# Patient Record
Sex: Male | Born: 1990 | Race: Black or African American | Hispanic: No
Health system: Western US, Academic
[De-identification: ages and names within clinical notes are randomized; demographics above are authoritative.]

---

## 2019-12-05 ENCOUNTER — Inpatient Hospital Stay: Admit: 2019-12-05 | Payer: Self-pay

## 2019-12-28 ENCOUNTER — Inpatient Hospital Stay (HOSPITAL_BASED_OUTPATIENT_CLINIC_OR_DEPARTMENT_OTHER): Payer: TRICARE Prime—HMO

## 2019-12-28 ENCOUNTER — Ambulatory Visit (HOSPITAL_BASED_OUTPATIENT_CLINIC_OR_DEPARTMENT_OTHER): Payer: TRICARE Prime—HMO

## 2019-12-28 ENCOUNTER — Inpatient Hospital Stay
Admission: AC | Admit: 2019-12-28 | Discharge: 2020-01-02 | DRG: 956 | Disposition: A | Discharge status: Other Acute Inpatient Hospital | Payer: TRICARE Prime—HMO | Attending: Surgery | Admitting: Surgery

## 2019-12-28 ENCOUNTER — Encounter (HOSPITAL_COMMUNITY): Payer: Self-pay | Admitting: Anesthesiology

## 2019-12-28 DIAGNOSIS — S82492A Other fracture of shaft of left fibula, initial encounter for closed fracture: Secondary | ICD-10-CM

## 2019-12-28 DIAGNOSIS — S22062D Unstable burst fracture of T7-T8 vertebra, subsequent encounter for fracture with routine healing: Secondary | ICD-10-CM

## 2019-12-28 DIAGNOSIS — T797XXA Traumatic subcutaneous emphysema, initial encounter: Secondary | ICD-10-CM

## 2019-12-28 DIAGNOSIS — S06309A Unspecified focal traumatic brain injury with loss of consciousness of unspecified duration, initial encounter: Secondary | ICD-10-CM

## 2019-12-28 DIAGNOSIS — Z7409 Other reduced mobility: Secondary | ICD-10-CM

## 2019-12-28 DIAGNOSIS — S066X0A Traumatic subarachnoid hemorrhage without loss of consciousness, initial encounter: Secondary | ICD-10-CM

## 2019-12-28 DIAGNOSIS — S22069A Unspecified fracture of T7-T8 vertebra, initial encounter for closed fracture: Secondary | ICD-10-CM

## 2019-12-28 DIAGNOSIS — M19071 Primary osteoarthritis, right ankle and foot: Secondary | ICD-10-CM

## 2019-12-28 DIAGNOSIS — S82832A Other fracture of upper and lower end of left fibula, initial encounter for closed fracture: Secondary | ICD-10-CM | POA: Diagnosis present

## 2019-12-28 DIAGNOSIS — D62 Acute posthemorrhagic anemia: Secondary | ICD-10-CM

## 2019-12-28 DIAGNOSIS — S22060A Wedge compression fracture of T7-T8 vertebra, initial encounter for closed fracture: Secondary | ICD-10-CM

## 2019-12-28 DIAGNOSIS — S22079A Unspecified fracture of T9-T10 vertebra, initial encounter for closed fracture: Secondary | ICD-10-CM

## 2019-12-28 DIAGNOSIS — K5900 Constipation, unspecified: Secondary | ICD-10-CM

## 2019-12-28 DIAGNOSIS — M19072 Primary osteoarthritis, left ankle and foot: Secondary | ICD-10-CM

## 2019-12-28 DIAGNOSIS — S12600A Unspecified displaced fracture of seventh cervical vertebra, initial encounter for closed fracture: Secondary | ICD-10-CM | POA: Diagnosis present

## 2019-12-28 DIAGNOSIS — S062X9A Diffuse traumatic brain injury with loss of consciousness of unspecified duration, initial encounter: Secondary | ICD-10-CM | POA: Diagnosis present

## 2019-12-28 DIAGNOSIS — S06301A Unspecified focal traumatic brain injury with loss of consciousness of 30 minutes or less, initial encounter: Secondary | ICD-10-CM

## 2019-12-28 DIAGNOSIS — S72302A Unspecified fracture of shaft of left femur, initial encounter for closed fracture: Secondary | ICD-10-CM

## 2019-12-28 DIAGNOSIS — R2689 Other abnormalities of gait and mobility: Secondary | ICD-10-CM

## 2019-12-28 DIAGNOSIS — R4020 Unspecified coma: Secondary | ICD-10-CM

## 2019-12-28 DIAGNOSIS — S064XAA Epidural hemorrhage with loss of consciousness status unknown, initial encounter (CMS-HCC): Secondary | ICD-10-CM

## 2019-12-28 DIAGNOSIS — Z0489 Encounter for examination and observation for other specified reasons: Secondary | ICD-10-CM

## 2019-12-28 DIAGNOSIS — N319 Neuromuscular dysfunction of bladder, unspecified: Secondary | ICD-10-CM

## 2019-12-28 DIAGNOSIS — S81012A Laceration without foreign body, left knee, initial encounter: Secondary | ICD-10-CM | POA: Diagnosis present

## 2019-12-28 DIAGNOSIS — S143XXA Injury of brachial plexus, initial encounter: Secondary | ICD-10-CM | POA: Diagnosis present

## 2019-12-28 DIAGNOSIS — Z20822 Contact with and (suspected) exposure to covid-19: Secondary | ICD-10-CM | POA: Diagnosis present

## 2019-12-28 DIAGNOSIS — S0630AA Unspecified focal traumatic brain injury with loss of consciousness status unknown, initial encounter (CMS-HCC): Secondary | ICD-10-CM | POA: Diagnosis present

## 2019-12-28 DIAGNOSIS — M12562 Traumatic arthropathy, left knee: Secondary | ICD-10-CM | POA: Diagnosis present

## 2019-12-28 DIAGNOSIS — S81011A Laceration without foreign body, right knee, initial encounter: Secondary | ICD-10-CM | POA: Diagnosis present

## 2019-12-28 DIAGNOSIS — T07XXXA Unspecified multiple injuries, initial encounter: Secondary | ICD-10-CM

## 2019-12-28 DIAGNOSIS — S52612A Displaced fracture of left ulna styloid process, initial encounter for closed fracture: Secondary | ICD-10-CM | POA: Diagnosis present

## 2019-12-28 DIAGNOSIS — M7989 Other specified soft tissue disorders: Secondary | ICD-10-CM

## 2019-12-28 DIAGNOSIS — R400 Somnolence: Secondary | ICD-10-CM | POA: Diagnosis present

## 2019-12-28 DIAGNOSIS — Z9689 Presence of other specified functional implants: Secondary | ICD-10-CM

## 2019-12-28 DIAGNOSIS — R918 Other nonspecific abnormal finding of lung field: Secondary | ICD-10-CM

## 2019-12-28 DIAGNOSIS — Z781 Physical restraint status: Secondary | ICD-10-CM

## 2019-12-28 DIAGNOSIS — G8911 Acute pain due to trauma: Secondary | ICD-10-CM | POA: Diagnosis present

## 2019-12-28 DIAGNOSIS — S22039A Unspecified fracture of third thoracic vertebra, initial encounter for closed fracture: Secondary | ICD-10-CM

## 2019-12-28 DIAGNOSIS — S2231XA Fracture of one rib, right side, initial encounter for closed fracture: Secondary | ICD-10-CM | POA: Diagnosis present

## 2019-12-28 DIAGNOSIS — E882 Lipomatosis, not elsewhere classified: Secondary | ICD-10-CM | POA: Diagnosis present

## 2019-12-28 DIAGNOSIS — M4804 Spinal stenosis, thoracic region: Secondary | ICD-10-CM

## 2019-12-28 DIAGNOSIS — Z23 Encounter for immunization: Secondary | ICD-10-CM

## 2019-12-28 DIAGNOSIS — S064X9A Epidural hemorrhage with loss of consciousness of unspecified duration, initial encounter: Secondary | ICD-10-CM | POA: Diagnosis present

## 2019-12-28 DIAGNOSIS — S22068A Other fracture of T7-T8 thoracic vertebra, initial encounter for closed fracture: Principal | ICD-10-CM | POA: Diagnosis present

## 2019-12-28 DIAGNOSIS — S134XXA Sprain of ligaments of cervical spine, initial encounter: Secondary | ICD-10-CM

## 2019-12-28 DIAGNOSIS — R4587 Impulsiveness: Secondary | ICD-10-CM | POA: Diagnosis present

## 2019-12-28 DIAGNOSIS — S72322A Displaced transverse fracture of shaft of left femur, initial encounter for closed fracture: Secondary | ICD-10-CM | POA: Diagnosis present

## 2019-12-28 DIAGNOSIS — S066X9A Traumatic subarachnoid hemorrhage with loss of consciousness of unspecified duration, initial encounter: Secondary | ICD-10-CM | POA: Diagnosis present

## 2019-12-28 DIAGNOSIS — S129XXA Fracture of neck, unspecified, initial encounter: Secondary | ICD-10-CM

## 2019-12-28 DIAGNOSIS — S72392A Other fracture of shaft of left femur, initial encounter for closed fracture: Secondary | ICD-10-CM

## 2019-12-28 DIAGNOSIS — Y92410 Unspecified street and highway as the place of occurrence of the external cause: Secondary | ICD-10-CM

## 2019-12-28 DIAGNOSIS — S0636AA Traumatic hemorrhage of cerebrum, unspecified, with loss of consciousness status unknown, initial encounter (CMS-HCC): Secondary | ICD-10-CM

## 2019-12-28 DIAGNOSIS — Z043 Encounter for examination and observation following other accident: Secondary | ICD-10-CM

## 2019-12-28 DIAGNOSIS — T1490XA Injury, unspecified, initial encounter: Secondary | ICD-10-CM

## 2019-12-28 LAB — PREPARE/CROSSMATCH PRBCS
Barcoded ABO/RH: 9500
Barcoded ABO/RH: 9500
Barcoded ABO/RH: 9500
Barcoded ABO/RH: 9500
Barcoded ABO/RH: 9500
Barcoded ABO/RH: 9500
Barcoded ABO/RH: 1700
Barcoded ABO/RH: 1700
Expiration: 202106042359
Expiration: 202106112359
Expiration: 202106112359
Expiration: 202106112359
Expiration: 202106142359
Expiration: 202106162359
Expiration: 202106032359
Type: O NEG
Type: O NEG
Type: O NEG
Type: O NEG
Type: O NEG
Type: O NEG
Type: B NEG

## 2019-12-28 LAB — INFLUENZA A/B & SARS-COV-2 PCR COMBO FOR RAPID RESPONSE LAB
Influenza A PCR, RRL: NOT DETECTED
Influenza B PCR, RRL: NOT DETECTED
SARS-CoV-2 PCR, RRL: NOT DETECTED

## 2019-12-28 LAB — BASIC METABOLIC PANEL, BLOOD
Anion Gap: 15 mmol/L (ref 7–15)
BUN: 15 mg/dL (ref 8–23)
Bicarbonate: 23 mmol/L (ref 22–29)
Calcium: 9.5 mg/dL (ref 8.2–9.6)
Chloride: 102 mmol/L (ref 98–107)
Creatinine: 1.37 mg/dL — ABNORMAL HIGH (ref 0.67–1.17)
GFR: 46 mL/min
Glucose: 155 mg/dL — ABNORMAL HIGH (ref 70–99)
Potassium: 4.6 mmol/L (ref 3.5–5.1)
Sodium: 140 mmol/L (ref 136–145)

## 2019-12-28 LAB — PREPARE PLATELET PHERESIS
Barcoded ABO/RH: 5100
Expiration: 202105312359
Type: O POS

## 2019-12-28 LAB — VBG+O2HBV+O2S+O2CNV
BE, Ven: 2 mmol/L
Flow Rate, Ven: 4 L/min
HCO3, Ven: 26 mmol/L
Hct (Est), Ven: 48 %
Hgb, Ven: 16 g/dL (ref 14.0–17.0)
O2 Content, Ven: 17.5 vol %
O2 Hgb, Ven: 78.1 — ABNORMAL HIGH (ref 40.0–70.0)
O2 Sat, Ven: 80.7 %
Temp, Ven: 35.8 'C
pCO2, Ven (T): 47 mmHg (ref 40–52)
pCO2, Ven (Uncorr): 49 mmHg (ref 40–52)
pH, Ven (T): 7.39 (ref 7.33–7.40)
pH, Ven (Uncorr): 7.37 (ref 7.33–7.40)
pO2, Ven (T): 38 mmHg (ref 25–44)
pO2, Ven (Uncorr): 41 mmHg (ref 25–44)

## 2019-12-28 LAB — MAGNESIUM, BLOOD: Magnesium: 1.9 mg/dL (ref 1.7–2.3)

## 2019-12-28 LAB — ALCOHOL, BLOOD: Alcohol: <11 mg/dL

## 2019-12-28 LAB — PROTHROMBIN TIME, BLOOD
INR: 1.2
PT,Patient: 13.2 s — ABNORMAL HIGH (ref 9.7–12.5)

## 2019-12-28 LAB — HEMOGRAM, BLOOD
Hct: 45.5 %
Hgb: 15.6 gm/dL
MCH: 31.6 pg (ref 26.0–32.0)
MCHC: 34.3 g/dL (ref 32.0–36.0)
MCV: 92.1 um3 (ref 79.0–95.0)
MPV: 10.4 fL (ref 9.4–12.4)
Plt Count: 232 10*3/uL (ref 140–370)
RBC: 4.94 10*6/uL
RDW: 13 % (ref 12.0–14.0)
WBC: 8.3 10*3/uL (ref 4.0–10.0)

## 2019-12-28 LAB — ABO/RH CONFIRMATION: ABO/RH: B NEG

## 2019-12-28 LAB — TYPE & SCREEN
ABO/RH: B NEG
Antibody Screen: NEGATIVE

## 2019-12-28 LAB — APTT, BLOOD: PTT: 26 s (ref 25–34)

## 2019-12-28 LAB — PHOSPHORUS, BLOOD: Phosphorous: 3.8 mg/dL (ref 2.7–4.5)

## 2019-12-28 MED ORDER — HYDROMORPHONE HCL 1 MG/ML IJ SOLN
0.5000 mg | INTRAMUSCULAR | Status: AC | PRN
Start: 2019-12-28 — End: 2019-12-29
  Administered 2019-12-28 – 2019-12-29 (×7): 0.5 mg via INTRAVENOUS
  Filled 2019-12-28: qty 0.5

## 2019-12-28 MED ORDER — LIDOCAINE HCL 1 % IJ SOLN
20.0000 mL | Freq: Once | INTRAMUSCULAR | Status: AC
Start: 2019-12-28 — End: 2019-12-29
  Filled 2019-12-28: qty 20

## 2019-12-28 MED ORDER — IOHEXOL 350 MG/ML IV SOLN
120.0000 mL | Freq: Once | INTRAVENOUS | Status: AC
Start: 2019-12-28 — End: 2019-12-28
  Administered 2019-12-28: 150 mL via INTRAVENOUS
  Filled 2019-12-28: qty 120

## 2019-12-28 MED ORDER — LIDOCAINE HCL 1 % IJ SOLN
INTRAMUSCULAR | Status: AC
Start: 2019-12-28 — End: 2019-12-28
  Filled 2019-12-28: qty 50

## 2019-12-28 MED ORDER — SODIUM CHLORIDE 0.9 % IV SOLN
INTRAVENOUS | Status: DC
Start: 2019-12-28 — End: 2019-12-31
  Administered 2019-12-29 (×2): 100 mL/h via INTRAVENOUS

## 2019-12-28 MED ORDER — HYDROMORPHONE HCL 1 MG/ML IJ SOLN
1.0000 mg | INTRAMUSCULAR | Status: DC | PRN
Start: 2019-12-28 — End: 2019-12-29
  Administered 2019-12-28: 1 mg via INTRAVENOUS
  Filled 2019-12-28: qty 1

## 2019-12-28 MED ORDER — FENTANYL CITRATE (PF) 50 MCG/ML IJ SOLN
INTRAMUSCULAR | Status: AC
Start: 2019-12-28 — End: 2019-12-28
  Filled 2019-12-28: qty 1

## 2019-12-28 MED ORDER — TETANUS-DIPHTH-ACELL PERTUSSIS 5-2.5-18.5 LF-MCG/0.5 IM SUSP
INTRAMUSCULAR | Status: AC
Start: 2019-12-28 — End: 2019-12-28
  Filled 2019-12-28: qty 0.5

## 2019-12-28 MED ORDER — HYDROMORPHONE HCL 1 MG/ML IJ SOLN
0.5000 mg | INTRAMUSCULAR | Status: DC | PRN
Start: 2019-12-28 — End: 2019-12-29

## 2019-12-28 MED ORDER — ACETAMINOPHEN 325 MG PO TABS
975.0000 mg | ORAL_TABLET | Freq: Three times a day (TID) | ORAL | Status: DC
Start: 2019-12-28 — End: 2020-01-02
  Administered 2019-12-29 – 2020-01-02 (×12): 975 mg via ORAL
  Filled 2019-12-28 (×12): qty 3

## 2019-12-28 MED ORDER — CEFAZOLIN SODIUM 1 GM IJ SOLR
INTRAMUSCULAR | Status: AC
Start: 2019-12-28 — End: 2019-12-28
  Filled 2019-12-28: qty 2000

## 2019-12-28 MED ORDER — BACITRACIN-POLYMYXIN B 500-10000 UNIT/GM EX OINT
TOPICAL_OINTMENT | Freq: Two times a day (BID) | CUTANEOUS | Status: DC
Start: 2019-12-28 — End: 2020-01-02
  Administered 2019-12-30: 09:00:00 1 via TOPICAL
  Filled 2019-12-28 (×2): qty 28.3

## 2019-12-28 MED ORDER — NALOXONE HCL 0.4 MG/ML IJ SOLN
0.1000 mg | INTRAMUSCULAR | Status: DC | PRN
Start: 2019-12-28 — End: 2020-01-02

## 2019-12-28 MED ORDER — MIDAZOLAM HCL 2 MG/2ML IJ SOLN
1.0000 mg | INTRAMUSCULAR | Status: AC | PRN
Start: 2019-12-28 — End: 2019-12-29
  Administered 2019-12-29 (×4): 1 mg via INTRAVENOUS

## 2019-12-28 MED ORDER — LEVETIRACETAM IN NACL 1000 MG/100ML IV SOLN
1000.0000 mg | Freq: Two times a day (BID) | INTRAVENOUS | Status: DC
Start: 2019-12-28 — End: 2020-01-01
  Administered 2019-12-28 – 2019-12-31 (×6): 1000 mg via INTRAVENOUS
  Filled 2019-12-28 (×6): qty 100

## 2019-12-28 MED ORDER — FAMOTIDINE IN NACL 20 MG/50ML IV SOLN
20.0000 mg | Freq: Two times a day (BID) | INTRAVENOUS | Status: DC
Start: 2019-12-28 — End: 2019-12-30
  Administered 2019-12-28 – 2019-12-29 (×3): 20 mg via INTRAVENOUS
  Filled 2019-12-28 (×2): qty 50

## 2019-12-28 MED ORDER — HYDROMORPHONE HCL 1 MG/ML IJ SOLN
1.0000 mg | Freq: Once | INTRAMUSCULAR | Status: AC
Start: 2019-12-28 — End: 2019-12-28
  Administered 2019-12-28 (×2): 1 mg via INTRAVENOUS
  Filled 2019-12-28: qty 1

## 2019-12-28 MED ORDER — LIDOCAINE HCL 1 % IJ SOLN
20.0000 mL | Freq: Once | INTRAMUSCULAR | Status: AC
Start: 2019-12-28 — End: 2019-12-28
  Administered 2019-12-28: 20:00:00 20 mL via INTRADERMAL

## 2019-12-28 MED ORDER — GABAPENTIN 300 MG OR CAPS
300.0000 mg | ORAL_CAPSULE | Freq: Three times a day (TID) | ORAL | Status: DC
Start: 2019-12-28 — End: 2020-01-02
  Administered 2019-12-29 – 2020-01-02 (×14): 300 mg via ORAL
  Filled 2019-12-28 (×14): qty 1

## 2019-12-28 MED ORDER — HYDROMORPHONE HCL 1 MG/ML IJ SOLN
0.5000 mg | INTRAMUSCULAR | Status: DC | PRN
Start: 2019-12-28 — End: 2019-12-30
  Administered 2019-12-29 – 2019-12-30 (×2): 0.5 mg via INTRAVENOUS
  Filled 2019-12-28: qty 0.5

## 2019-12-28 MED ORDER — HYDROMORPHONE HCL 1 MG/ML IJ SOLN
INTRAMUSCULAR | Status: AC
Start: 2019-12-28 — End: 2019-12-28
  Filled 2019-12-28: qty 1
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
## 2019-12-29 ENCOUNTER — Inpatient Hospital Stay (HOSPITAL_BASED_OUTPATIENT_CLINIC_OR_DEPARTMENT_OTHER): Payer: TRICARE Prime—HMO

## 2019-12-29 ENCOUNTER — Encounter (HOSPITAL_COMMUNITY): Payer: Self-pay | Admitting: Surgical Critical Care

## 2019-12-29 ENCOUNTER — Encounter (HOSPITAL_COMMUNITY): Admission: AC | Disposition: A | Discharge status: Other Acute Inpatient Hospital | Payer: Self-pay | Attending: Surgery

## 2019-12-29 ENCOUNTER — Inpatient Hospital Stay (HOSPITAL_COMMUNITY): Payer: TRICARE Prime—HMO | Admitting: Anesthesiology

## 2019-12-29 DIAGNOSIS — S22069A Unspecified fracture of T7-T8 vertebra, initial encounter for closed fracture: Secondary | ICD-10-CM

## 2019-12-29 DIAGNOSIS — M4802 Spinal stenosis, cervical region: Secondary | ICD-10-CM

## 2019-12-29 DIAGNOSIS — S7292XA Unspecified fracture of left femur, initial encounter for closed fracture: Secondary | ICD-10-CM

## 2019-12-29 DIAGNOSIS — S24103A Unspecified injury at T7-T10 level of thoracic spinal cord, initial encounter: Secondary | ICD-10-CM

## 2019-12-29 DIAGNOSIS — R600 Localized edema: Secondary | ICD-10-CM

## 2019-12-29 DIAGNOSIS — S066X9A Traumatic subarachnoid hemorrhage with loss of consciousness of unspecified duration, initial encounter: Secondary | ICD-10-CM

## 2019-12-29 DIAGNOSIS — Z9889 Other specified postprocedural states: Secondary | ICD-10-CM

## 2019-12-29 DIAGNOSIS — S22060A Wedge compression fracture of T7-T8 vertebra, initial encounter for closed fracture: Secondary | ICD-10-CM

## 2019-12-29 DIAGNOSIS — S22068A Other fracture of T7-T8 thoracic vertebra, initial encounter for closed fracture: Principal | ICD-10-CM

## 2019-12-29 DIAGNOSIS — T1490XA Injury, unspecified, initial encounter: Secondary | ICD-10-CM

## 2019-12-29 DIAGNOSIS — M50223 Other cervical disc displacement at C6-C7 level: Secondary | ICD-10-CM

## 2019-12-29 DIAGNOSIS — S82832A Other fracture of upper and lower end of left fibula, initial encounter for closed fracture: Secondary | ICD-10-CM

## 2019-12-29 DIAGNOSIS — S72352A Displaced comminuted fracture of shaft of left femur, initial encounter for closed fracture: Secondary | ICD-10-CM

## 2019-12-29 DIAGNOSIS — S064X9A Epidural hemorrhage with loss of consciousness of unspecified duration, initial encounter: Secondary | ICD-10-CM

## 2019-12-29 DIAGNOSIS — S143XXA Injury of brachial plexus, initial encounter: Secondary | ICD-10-CM

## 2019-12-29 DIAGNOSIS — S12600A Unspecified displaced fracture of seventh cervical vertebra, initial encounter for closed fracture: Secondary | ICD-10-CM

## 2019-12-29 DIAGNOSIS — E882 Lipomatosis, not elsewhere classified: Secondary | ICD-10-CM

## 2019-12-29 DIAGNOSIS — S298XXA Other specified injuries of thorax, initial encounter: Secondary | ICD-10-CM

## 2019-12-29 DIAGNOSIS — S52615A Nondisplaced fracture of left ulna styloid process, initial encounter for closed fracture: Secondary | ICD-10-CM

## 2019-12-29 DIAGNOSIS — M4804 Spinal stenosis, thoracic region: Secondary | ICD-10-CM

## 2019-12-29 DIAGNOSIS — Z041 Encounter for examination and observation following transport accident: Secondary | ICD-10-CM

## 2019-12-29 DIAGNOSIS — S72322A Displaced transverse fracture of shaft of left femur, initial encounter for closed fracture: Secondary | ICD-10-CM

## 2019-12-29 LAB — URINE IMMUNOASSAY DRUG SCREEN
Amphetamines Screen: NEGATIVE
Barbiturates Screen: NEGATIVE
Benzodiazepine Screen: NEGATIVE
Cocaine Screen: NEGATIVE
Methadone Screen: NEGATIVE
Opiates Screen: NEGATIVE
Oxycodone Screen: NEGATIVE
Phencyclidine Screen: NEGATIVE
THC Screen: NEGATIVE

## 2019-12-29 LAB — MDIFF
Bands: 19 % — ABNORMAL HIGH (ref 0–15)
Number of Cells Counted: 118
Plt Est: ADEQUATE

## 2019-12-29 LAB — ABG+O2HBA+O2S A+O2CNA
Arterial pF Ratio: 365 mmHg
Arterial pF Ratio: 401 mmHg
Arterial pF Ratio: 478 mmHg
BE, Art: -1 mmol/L
BE, Art: -0.8 mmol/L
BE, Art: -2 mmol/L
FIO2: 80 %
FIO2: 80 %
FIO2: 80 %
HCO3, Art: 24 mmol/L
HCO3, Art: 24 mmol/L
HCO3, Art: 23 mmol/L
Hct (Est), Art: 32 %
Hct (Est), Art: 29 %
Hct (Est), Art: 28 %
Hgb, Art: 10.8 g/dL — ABNORMAL LOW (ref 14.0–17.0)
Hgb, Art: 9.8 g/dL — ABNORMAL LOW (ref 14.0–17.0)
Hgb, Art: 9.2 g/dL — ABNORMAL LOW (ref 14.0–17.0)
O2 Content, Art: 15.4 vol % (ref 15.0–23.0)
O2 Content, Art: 14.2 vol % — ABNORMAL LOW (ref 15.0–23.0)
O2 Content, Art: 13.6 vol % — ABNORMAL LOW (ref 15.0–23.0)
O2 Hgb, Art: 96.8 (ref 95.0–97.0)
O2 Hgb, Art: 97.1 — ABNORMAL HIGH (ref 95.0–97.0)
O2 Hgb, Art: 96.8 (ref 95.0–97.0)
O2 Sat, Art: 99.1 % (ref 94.0–100.0)
O2 Sat, Art: 99.5 % (ref 94.0–100.0)
O2 Sat, Art: 99.1 % (ref 94.0–100.0)
Temp: 37 'C
Temp: 37 'C
Temp: 37 'C
pCO2, Art (T): 42 mmHg (ref 36–46)
pCO2, Art (T): 41 mmHg (ref 36–46)
pCO2, Art (T): 44 mmHg (ref 36–46)
pCO2, Art (Uncorr): 42 mmHg (ref 36–46)
pCO2, Art (Uncorr): 41 mmHg (ref 36–46)
pCO2, Art (Uncorr): 44 mmHg (ref 36–46)
pH, Art (T): 7.37 (ref 7.35–7.46)
pH, Art (T): 7.38 (ref 7.35–7.46)
pH, Art (T): 7.34 — ABNORMAL LOW (ref 7.35–7.46)
pH, Art (Uncorr): 7.37 (ref 7.35–7.46)
pH, Art (Uncorr): 7.38 (ref 7.35–7.46)
pH, Art (Uncorr): 7.34 (ref 7.35–7.46)
pO2, Art (T): 292 mmHg — ABNORMAL HIGH (ref 74–109)
pO2, Art (T): 321 mmHg — ABNORMAL HIGH (ref 74–109)
pO2, Art (T): 382 mmHg — ABNORMAL HIGH (ref 74–109)
pO2, Art (Uncorr): 292 mmHg (ref 74–109)
pO2, Art (Uncorr): 321 mmHg (ref 74–109)
pO2, Art (Uncorr): 382 mmHg (ref 74–109)

## 2019-12-29 LAB — CBC WITH DIFF, BLOOD
ANC-Automated: 4.4 10*3/uL (ref 1.6–7.0)
ANC-Automated: 4.8 10*3/uL (ref 1.6–7.0)
ANC-Manual Mode: 8.5 10*3/uL — ABNORMAL HIGH (ref 1.6–7.0)
Abs Basophils: 0.1 10*3/uL
Abs Basophils: 0 10*3/uL
Abs Basophils: 0 10*3/uL
Abs Eosinophils: 0 10*3/uL (ref 0.0–0.5)
Abs Eosinophils: 0 10*3/uL (ref 0.0–0.5)
Abs Eosinophils: 0 10*3/uL (ref 0.0–0.5)
Abs Lymphs: 0.5 10*3/uL — ABNORMAL LOW (ref 0.8–3.1)
Abs Lymphs: 0.7 10*3/uL — ABNORMAL LOW (ref 0.8–3.1)
Abs Lymphs: 0.9 10*3/uL (ref 0.8–3.1)
Abs Monos: 1 10*3/uL — ABNORMAL HIGH (ref 0.2–0.8)
Abs Monos: 0.4 10*3/uL (ref 0.2–0.8)
Abs Monos: 0.7 10*3/uL (ref 0.2–0.8)
Basophils: 1 %
Basophils: 0 %
Basophils: 0 %
Eosinophils: 0 %
Eosinophils: 0 %
Eosinophils: 0 %
Hct: 39.3 %
Hct: 27.6 %
Hct: 27.2 %
Hgb: 13.2 gm/dL
Hgb: 9.3 gm/dL
Hgb: 9.2 gm/dL
Lymphocytes: 5 %
Lymphocytes: 13 %
Lymphocytes: 14 %
MCH: 30.6 pg (ref 26.0–32.0)
MCH: 30.6 pg (ref 26.0–32.0)
MCH: 30.9 pg (ref 26.0–32.0)
MCHC: 33.6 g/dL (ref 32.0–36.0)
MCHC: 33.7 g/dL (ref 32.0–36.0)
MCHC: 33.8 g/dL (ref 32.0–36.0)
MCV: 91 um3 (ref 79.0–95.0)
MCV: 90.8 um3 (ref 79.0–95.0)
MCV: 91.3 um3 (ref 79.0–95.0)
MPV: 10.5 fL (ref 9.4–12.4)
MPV: 9.9 fL (ref 9.4–12.4)
MPV: 10.2 fL (ref 9.4–12.4)
Monocytes: 10 %
Monocytes: 7 %
Monocytes: 10 %
Plt Count: 178 10*3/uL (ref 140–370)
Plt Count: 105 10*3/uL — ABNORMAL LOW (ref 140–370)
Plt Count: 100 10*3/uL — ABNORMAL LOW (ref 140–370)
RBC: 4.32 10*6/uL
RBC: 3.04 10*6/uL
RBC: 2.98 10*6/uL
RDW: 14 % (ref 12.0–14.0)
RDW: 14.1 % — ABNORMAL HIGH (ref 12.0–14.0)
RDW: 14.2 % — ABNORMAL HIGH (ref 12.0–14.0)
Segs: 65 %
Segs: 80 %
Segs: 76 %
WBC: 10.1 10*3/uL — ABNORMAL HIGH (ref 4.0–10.0)
WBC: 5.5 10*3/uL (ref 4.0–10.0)
WBC: 6.4 10*3/uL (ref 4.0–10.0)

## 2019-12-29 LAB — GLUCOSE, ARTERIAL WHOLE BLOOD
Glucose, Art: 136 mg/dL — ABNORMAL HIGH (ref 65–110)
Glucose, Art: 145 mg/dL — ABNORMAL HIGH (ref 65–110)
Glucose, Art: 155 mg/dL — ABNORMAL HIGH (ref 65–110)

## 2019-12-29 LAB — PHOSPHORUS, BLOOD
Phosphorous: 5.1 mg/dL — ABNORMAL HIGH (ref 2.7–4.5)
Phosphorous: 3.6 mg/dL (ref 2.7–4.5)

## 2019-12-29 LAB — PREPARE FFP/THAWED PLASMA
Barcoded ABO/RH: 2800
Barcoded ABO/RH: 2800
Expiration: 202106042359
Expiration: 202106042359
Type: AB NEG
Type: AB NEG

## 2019-12-29 LAB — SODIUM, ART WHOLE BLOOD
Sodium, Art: 138 mmol/L (ref 135–145)
Sodium, Art: 137 mmol/L (ref 135–145)
Sodium, Art: 138 mmol/L (ref 135–145)

## 2019-12-29 LAB — BASIC METABOLIC PANEL, BLOOD
Anion Gap: 11 mmol/L (ref 7–15)
Anion Gap: 10 mmol/L (ref 7–15)
BUN: 17 mg/dL (ref 8–23)
BUN: 15 mg/dL (ref 8–23)
Bicarbonate: 27 mmol/L (ref 22–29)
Bicarbonate: 23 mmol/L (ref 22–29)
Calcium: 8.8 mg/dL (ref 8.2–9.6)
Calcium: 8.4 mg/dL (ref 8.2–9.6)
Chloride: 103 mmol/L (ref 98–107)
Chloride: 111 mmol/L — ABNORMAL HIGH (ref 98–107)
Creatinine: 1.5 mg/dL — ABNORMAL HIGH (ref 0.67–1.17)
Creatinine: 1.19 mg/dL — ABNORMAL HIGH (ref 0.67–1.17)
GFR: 41 mL/min
GFR: 54 mL/min
Glucose: 132 mg/dL — ABNORMAL HIGH (ref 70–99)
Glucose: 135 mg/dL — ABNORMAL HIGH (ref 70–99)
Potassium: 5.3 mmol/L — ABNORMAL HIGH (ref 3.5–5.1)
Potassium: 4.9 mmol/L (ref 3.5–5.1)
Sodium: 141 mmol/L (ref 136–145)
Sodium: 144 mmol/L (ref 136–145)

## 2019-12-29 LAB — POTASSIUM, ART WHOLE BLOOD
Potassium, Art: 4 mmol/L (ref 3.5–5.0)
Potassium, Art: 4.8 mmol/L (ref 3.5–5.0)
Potassium, Art: 4.7 mmol/L (ref 3.5–5.0)

## 2019-12-29 LAB — HEPATITIS C AB, BLOOD: Hepatitis C Ab: NONREACTIVE

## 2019-12-29 LAB — HEPATITIS B SURFACE AG, BLOOD: HBsAg: NONREACTIVE

## 2019-12-29 LAB — CALCIUM, IONIZED, ARTERIAL
Ca Ionized, Art: 1.06 mmol/L — ABNORMAL LOW (ref 1.13–1.32)
Ca Ionized, Art: 1.24 mmol/L (ref 1.13–1.32)
Ca Ionized, Art: 1.14 mmol/L (ref 1.13–1.32)

## 2019-12-29 LAB — RAPID HIV-1/2 ANTIBODY AND HIV-1 P24 ANTIGEN
HIV 1/2 Rapid Antibody Test: NONREACTIVE
Rapid HIV p24 Antigen: NONREACTIVE

## 2019-12-29 LAB — MAGNESIUM, BLOOD
Magnesium: 1.8 mg/dL (ref 1.7–2.3)
Magnesium: 1.6 mg/dL — ABNORMAL LOW (ref 1.7–2.3)

## 2019-12-29 LAB — HIV 1/2 ANTIBODY & P24 ANTIGEN ASSAY, BLOOD: HIV 1/2 Antibody & P24 Antigen Assay: NONREACTIVE

## 2019-12-29 LAB — GLUCOSE (POCT): Glucose (POCT): 153 mg/dL — ABNORMAL HIGH (ref 70–99)

## 2019-12-29 SURGERY — LAMINECTOMY, THORACIC,  WITH FUSION AND INSTRUMENTATION, 5 OR MORE  LEVELS, POSTERIOR APPROACH
Anesthesia: General | Site: Spine Thoracic

## 2019-12-29 SURGERY — INSERTION, INTRAMEDULLARY ROD, FEMUR, ANTEGRADE
Anesthesia: General | Site: Spine Thoracic | Laterality: Left | Wound class: Class II (Clean Contaminated)

## 2019-12-29 MED ORDER — VASOPRESSIN 20 UNIT/ML IV SOLN
INTRAVENOUS | Status: DC | PRN
Start: 2019-12-29 — End: 2019-12-29
  Administered 2019-12-29 (×4): 2 [IU] via INTRAVENOUS

## 2019-12-29 MED ORDER — ONDANSETRON HCL 4 MG/2ML IV SOLN
4.0000 mg | Freq: Once | INTRAMUSCULAR | Status: DC | PRN
Start: 2019-12-29 — End: 2019-12-29

## 2019-12-29 MED ORDER — ALBUMIN HUMAN 5 % IV SOLN
INTRAVENOUS | Status: DC | PRN
Start: 2019-12-29 — End: 2019-12-29

## 2019-12-29 MED ORDER — GADOBUTROL 1 MMOL/ML IV SOLN
INTRAVENOUS | Status: AC
Start: 2019-12-29 — End: 2019-12-29
  Filled 2019-12-29: qty 10

## 2019-12-29 MED ORDER — VANCOMYCIN HCL 1 GM IV SOLR
INTRAVENOUS | Status: DC | PRN
Start: 2019-12-29 — End: 2019-12-29
  Administered 2019-12-29 (×2): 1000 mg via TOPICAL

## 2019-12-29 MED ORDER — FENTANYL CITRATE (PF) 50 MCG/ML IJ SOLN (WRAPPED RECORD) ~~LOC~~
25.0000 ug | INTRAMUSCULAR | Status: DC | PRN
Start: 2019-12-29 — End: 2019-12-29

## 2019-12-29 MED ORDER — KETAMINE HCL 50 MG/5ML IJ SOSY
PREFILLED_SYRINGE | INTRAMUSCULAR | Status: AC
Start: 2019-12-29 — End: 2019-12-29
  Filled 2019-12-29: qty 5

## 2019-12-29 MED ORDER — CALCIUM CHLORIDE 10 % IV SOLN
INTRAVENOUS | Status: DC | PRN
Start: 2019-12-29 — End: 2019-12-29
  Administered 2019-12-29 (×2): 1000 mg via INTRAVENOUS

## 2019-12-29 MED ORDER — ROCURONIUM BROMIDE 100 MG/10ML IV SOLN
INTRAVENOUS | Status: DC | PRN
Start: 2019-12-29 — End: 2019-12-29
  Administered 2019-12-29 (×2): 100 mg via INTRAVENOUS

## 2019-12-29 MED ORDER — GADOBUTROL 1 MMOL/ML IV SOLN
10.0000 mL | Freq: Once | INTRAVENOUS | Status: AC
Start: 2019-12-29 — End: 2019-12-29
  Administered 2019-12-29: 04:00:00 10 mL via INTRAVENOUS
  Filled 2019-12-29: qty 10

## 2019-12-29 MED ORDER — MIDAZOLAM HCL 2 MG/2ML IJ SOLN
INTRAMUSCULAR | Status: AC
Start: 2019-12-29 — End: 2019-12-29
  Filled 2019-12-29: qty 2

## 2019-12-29 MED ORDER — HYDROMORPHONE HCL 1 MG/ML IJ SOLN
0.5000 mg | INTRAMUSCULAR | Status: DC | PRN
Start: 2019-12-29 — End: 2019-12-29

## 2019-12-29 MED ORDER — NALOXONE HCL 0.4 MG/ML IJ SOLN
0.1000 mg | INTRAMUSCULAR | Status: DC | PRN
Start: 2019-12-29 — End: 2019-12-29

## 2019-12-29 MED ORDER — HYDROMORPHONE HCL 1 MG/ML IJ SOLN
1.0000 mg | INTRAMUSCULAR | Status: AC | PRN
Start: 2019-12-29 — End: 2019-12-29
  Filled 2019-12-29: qty 1

## 2019-12-29 MED ORDER — FENTANYL CITRATE (PF) 250 MCG/5ML IJ SOLN
INTRAMUSCULAR | Status: AC
Start: 2019-12-29 — End: 2019-12-29
  Filled 2019-12-29: qty 5

## 2019-12-29 MED ORDER — FENTANYL CITRATE (PF) 250 MCG/5ML IJ SOLN
INTRAMUSCULAR | Status: DC | PRN
Start: 2019-12-29 — End: 2019-12-29
  Administered 2019-12-29: 50 ug via INTRAVENOUS
  Administered 2019-12-29: 100 ug via INTRAVENOUS
  Administered 2019-12-29 (×2): 50 ug via INTRAVENOUS

## 2019-12-29 MED ORDER — REMIFENTANIL HCL 2 MG IV SOLR
INTRAVENOUS | Status: AC
Start: 2019-12-29 — End: 2019-12-29
  Filled 2019-12-29: qty 4

## 2019-12-29 MED ORDER — LIDOCAINE-EPINEPHRINE 0.5 %-1:200000 IJ SOLN
INTRAMUSCULAR | Status: DC | PRN
Start: 2019-12-29 — End: 2019-12-29
  Administered 2019-12-29 (×2): 50 mL

## 2019-12-29 MED ORDER — PHENYLEPHRINE DILUTION 100 MCG/ML IJ SOLN
INTRAVENOUS | Status: DC | PRN
Start: 2019-12-29 — End: 2019-12-29
  Administered 2019-12-29 (×4): 200 ug via INTRAVENOUS

## 2019-12-29 MED ORDER — HYDROMORPHONE HCL 1 MG/ML IJ SOLN
INTRAMUSCULAR | Status: DC | PRN
Start: 2019-12-29 — End: 2019-12-29
  Administered 2019-12-29: 1 mg via INTRAVENOUS

## 2019-12-29 MED ORDER — MAGNESIUM SULFATE 2 GM/50ML IV SOLN
2.0000 g | Freq: Once | INTRAVENOUS | Status: AC
Start: 2019-12-29 — End: 2019-12-29
  Administered 2019-12-29 (×2): 2 g via INTRAVENOUS
  Filled 2019-12-29: qty 50

## 2019-12-29 MED ORDER — LABETALOL HCL 5 MG/ML IV SOLN
10.0000 mg | Freq: Four times a day (QID) | INTRAVENOUS | Status: DC | PRN
Start: 2019-12-29 — End: 2020-01-02
  Filled 2019-12-29: qty 2

## 2019-12-29 MED ORDER — PROPOFOL 200 MG/20ML IV EMUL
INTRAVENOUS | Status: DC | PRN
Start: 2019-12-29 — End: 2019-12-29
  Administered 2019-12-29: 200 mg via INTRAVENOUS

## 2019-12-29 MED ORDER — BACITRACIN 500 UNIT/GM EX OINT (CUSTOM)
TOPICAL_OINTMENT | CUTANEOUS | Status: DC | PRN
Start: 2019-12-29 — End: 2019-12-29
  Administered 2019-12-29: 1 via TOPICAL

## 2019-12-29 MED ORDER — CEFAZOLIN SODIUM-DEXTROSE 2-4 GM/100ML-% IV SOLN
2000.0000 mg | Freq: Three times a day (TID) | INTRAVENOUS | Status: DC
Start: 2019-12-29 — End: 2020-01-01
  Administered 2019-12-29 – 2020-01-01 (×10): 2000 mg via INTRAVENOUS
  Filled 2019-12-29 (×10): qty 100

## 2019-12-29 MED ORDER — LIDOCAINE HCL 2 % IJ SOLN WRAPPED RECORD
INTRAMUSCULAR | Status: DC | PRN
Start: 2019-12-29 — End: 2019-12-29
  Administered 2019-12-29 (×2): 100 mg via INTRAVENOUS

## 2019-12-29 MED ORDER — HYDRALAZINE HCL 20 MG/ML IJ SOLN
10.0000 mg | Freq: Four times a day (QID) | INTRAMUSCULAR | Status: DC | PRN
Start: 2019-12-29 — End: 2020-01-02
  Filled 2019-12-29: qty 1

## 2019-12-29 MED ORDER — CEFAZOLIN SODIUM 1 GM IJ SOLR
INTRAMUSCULAR | Status: DC | PRN
Start: 2019-12-29 — End: 2019-12-29
  Administered 2019-12-29 (×2): 2000 mg via INTRAVENOUS

## 2019-12-29 MED ORDER — SODIUM CHLORIDE 0.9 % IV SOLN
INTRAVENOUS | Status: DC | PRN
Start: 2019-12-29 — End: 2019-12-29
  Administered 2019-12-29: .1 ug/kg/min via INTRAVENOUS

## 2019-12-29 MED ORDER — ONDANSETRON HCL 4 MG/2ML IV SOLN
INTRAMUSCULAR | Status: DC | PRN
Start: 2019-12-29 — End: 2019-12-29
  Administered 2019-12-29: 4 mg via INTRAVENOUS

## 2019-12-29 MED ORDER — MIDAZOLAM HCL 2 MG/2ML IJ SOLN
INTRAMUSCULAR | Status: DC | PRN
Start: 2019-12-29 — End: 2019-12-29
  Administered 2019-12-29: 2 mg via INTRAVENOUS

## 2019-12-29 MED ORDER — LIDOCAINE 4 % EX PTCH
1.0000 | MEDICATED_PATCH | CUTANEOUS | Status: DC
Start: 2019-12-29 — End: 2020-01-02
  Administered 2019-12-29 – 2020-01-02 (×4): 1 via TRANSDERMAL
  Filled 2019-12-29 (×5): qty 1

## 2019-12-29 MED ORDER — PROPOFOL 1000 MG/100ML IV EMUL
INTRAVENOUS | Status: DC | PRN
Start: 2019-12-29 — End: 2019-12-29
  Administered 2019-12-29: 125 ug/kg/min via INTRAVENOUS

## 2019-12-29 MED ORDER — SODIUM CHLORIDE 0.9 % IV SOLN
INTRAVENOUS | Status: DC | PRN
Start: 2019-12-29 — End: 2019-12-29
  Administered 2019-12-29: 60 ug/min via INTRAVENOUS

## 2019-12-29 MED ORDER — KETAMINE HCL 50 MG/5ML IJ SOSY
PREFILLED_SYRINGE | INTRAMUSCULAR | Status: DC | PRN
Start: 2019-12-29 — End: 2019-12-29
  Administered 2019-12-29 (×2): 50 mg via INTRAVENOUS

## 2019-12-29 MED ORDER — HYDROMORPHONE HCL 2 MG/ML IJ SOLN
INTRAMUSCULAR | Status: AC
Start: 2019-12-29 — End: 2019-12-29
  Filled 2019-12-29: qty 1

## 2019-12-29 MED ORDER — DEXAMETHASONE SODIUM PHOSPHATE 4 MG/ML IJ SOLN (CUSTOM)
INTRAMUSCULAR | Status: DC | PRN
Start: 2019-12-29 — End: 2019-12-29
  Administered 2019-12-29: 10 mg via INTRAVENOUS

## 2019-12-29 MED ORDER — LACTATED RINGERS IV SOLN
INTRAVENOUS | Status: DC
Start: 2019-12-29 — End: 2019-12-29

## 2019-12-29 MED ORDER — HYDROMORPHONE HCL 1 MG/ML IJ SOLN
INTRAMUSCULAR | Status: AC
Start: 2019-12-29 — End: 2019-12-29
  Filled 2019-12-29: qty 1

## 2019-12-29 MED ORDER — LIDOCAINE HCL 1 % IJ SOLN
10.0000 mL | Freq: Once | INTRAMUSCULAR | Status: AC
Start: 2019-12-29 — End: 2019-12-29
  Administered 2019-12-29: 10 mL via INTRADERMAL
  Filled 2019-12-29: qty 10

## 2019-12-29 MED ORDER — MEPERIDINE HCL 25 MG/ML IJ SOLN
12.5000 mg | INTRAMUSCULAR | Status: DC | PRN
Start: 2019-12-29 — End: 2019-12-29

## 2019-12-29 MED ORDER — LACTATED RINGERS IV SOLN
INTRAVENOUS | Status: DC | PRN
Start: 2019-12-29 — End: 2019-12-29

## 2019-12-29 SURGICAL SUPPLY — 79 items
ALCOHOL RUBBING 70% 4 OZ (Misc Medical Supply) ×3 IMPLANT
BANDAGE COBAN 6 STERILE LATEX FREE (Dressings/packing) ×3 IMPLANT
BIT DRILL 4.2MM 100MM CALIS (Drills/Bits/Burs/Taps/Reamers) ×3 IMPLANT
BLADE SURGEON #15 STERILE (Knives/Blades) ×6 IMPLANT
CORD VALLEYLAB BIPOLAR FORCEP- SINGE USE (Cautery) ×3 IMPLANT
COVER LIGHT HANDLE RIGID (2 PACK) (Misc Surgical Supply) ×3 IMPLANT
Cannulated Modular Screw Shank 5.5mm x 40mm ×6 IMPLANT
DRAIN RELIAVAC FLAT 7MM X 20CM 0070430 (Lines/Drains) ×3 IMPLANT
DRAIN SUCTION EVAC RELIAVAC 100CC (Lines/Drains) ×3 IMPLANT
DRAINAGE CATHETER VENTRICULAR VENTRICLEAR II 1.5FR X 33CM (Uncategorized Implant) IMPLANT
DRAPE 3/4 W/REINFORCEMENT 53" X 77" (Drapes/towels) ×6 IMPLANT
DRAPE C-ARMOR FLUOROSCAN IMAGING (Drapes/towels) ×6 IMPLANT
DRAPE IOBAN 2 ANTIMICROBIAL 23" X 17" (Misc Surgical Supply) ×2
DRAPE POUCH STERI INST 7X11 (Drapes/towels) ×1
DRAPE POUCH STERI INST 7X11 1018 (Drapes/towels) ×2 IMPLANT
DRAPE X-RAY C ARM 41" X 74" (Drapes/towels) ×3 IMPLANT
DRESSING MEPILEX BORDER LITE 10CM X 10CM SOFT SILICONE THIN FOAM (Dressings/packing) ×3 IMPLANT
DRESSING MEPILEX BORDER LITE 7.5CM X 7.5CM SOFT SILICONE THIN FOAM (Dressings/packing) ×3 IMPLANT
DRESSING MEPILEX BORDER SACRUM SM 16 X 20CM (Dressings/packing) ×15 IMPLANT
DRESSING MEPILEX BORDER SILICONE 4"" X 8"" (Dressings/packing) ×3 IMPLANT
DRESSING SPONGE CURITY 4X4 16 PLY (Dressings/packing) ×3 IMPLANT
DRESSING TEGADERM 6" X 8" (Dressings/packing) ×6 IMPLANT
DRESSING TEGADERM HP 2.375" X 2.375" (Dressings/packing) ×3 IMPLANT
DRESSING TELFA STRL 3" X 8" (Dressings/packing) ×6 IMPLANT
DRESSING XEROFORM STRL 1X8 (Dressings/packing) ×3 IMPLANT
DRILL BIT 4.2MM STERILE (Drills/Bits/Burs/Taps/Reamers) ×3 IMPLANT
DRILL PRECISION NEURO 3.0 X 3.8MM (Drills/Bits/Burs/Taps/Reamers) ×3 IMPLANT
FILM IOBAN 2 ANTIMICROBIAL 23" X 17" (Misc Surgical Supply) ×4 IMPLANT
FORCEP BIPOLAR IRRIGATING 8.25, 1.5MM TIP SINGLE USE (Cautery) ×3 IMPLANT
GLOVE BIOGEL INDICATOR UNDERGLOVE SIZE 8 (Gloves/Gowns) ×6 IMPLANT
GLOVE BIOGEL INDICATOR UNDERGLOVE SIZE 8.5 (Gloves/Gowns) ×3 IMPLANT
GLOVE BIOGEL PI INDICATOR SIZE 8 (Gloves/Gowns) ×9 IMPLANT
GLOVE BIOGEL PI INDICATOR SIZE 8.5 (Gloves/Gowns) ×3 IMPLANT
GLOVE BIOGEL PI ULTRATOUCH SIZE 7.5 (Gloves/Gowns) ×12 IMPLANT
GLOVE BIOGEL PI ULTRATOUCH SIZE 8 (Gloves/Gowns) ×15 IMPLANT
GLOVE SURGEON BIOGEL SIZE 7 (Gloves/Gowns) ×3 IMPLANT
GLOVE SURGEON BIOGEL SIZE 7.5 (Gloves/Gowns) ×12 IMPLANT
GLOVE SURGICAL BIOGEL SIZE 8 (Gloves/Gowns) ×15 IMPLANT
GLOVE SURGICAL BIOGEL SIZE 8.5 (Gloves/Gowns) ×6 IMPLANT
GOWN NON-REINFORCED SIRUS SET IN SLV XL (Gloves/Gowns) ×6 IMPLANT
GOWN SIRUS LG BLUE, AAMI LVL 4 (Gloves/Gowns) ×3 IMPLANT
GOWN SIRUS XLG BLUE, AAMI LVL 4 (Gloves/Gowns) ×6 IMPLANT
GUIDE WIRE 3.2MM/400MM (Misc Medical Supply) ×3 IMPLANT
NAIL TI CANN 11MM FRN/GT 400MM LEFT STERILE ×3 IMPLANT
NEEDLE SPINAL 18GA X 3.5" (Needles/punch/cannula/biopsy) ×3 IMPLANT
OR SET-UP V PACK (Drapes/towels) ×3 IMPLANT
PACK SPINAL SURGERY (Procedure Packs/kits) ×3 IMPLANT
PAD GROUND VALLEYLAB REM ADULT E7507 (Misc Surgical Supply) ×12 IMPLANT
PIN HEAD PINK .078MM X 42MM FITS 2.0MM (Misc Medical Supply) ×3 IMPLANT
PROTECTOR ULNAR NERVE PAD, YELLOW (Misc Surgical Supply) ×9 IMPLANT
ROD LORDOTIC 5.5 X 70MM (Spine cages/spacers/discs/fixation) ×3 IMPLANT
ROD LORDOTIC TI MIS 5.5 X 65MM (Spine cages/spacers/discs/fixation) ×3 IMPLANT
ROD REAMING 2.5MM/950MM (Drills/Bits/Burs/Taps/Reamers) ×3 IMPLANT
SCREW IM NAIL TI 5 X 40MM, LOCKING, T25 (Screws/anchors/cables) ×6 IMPLANT
SCREW IM NAIL TI 5 X 44MM, LOCKING, T25 (Screws/anchors/cables) ×3 IMPLANT
SCREW IM NAIL TI 5 X 70MM, LOCKING, T25 (Screws/anchors/cables) ×3 IMPLANT
SCREW SET KODIAK (Screws/anchors/cables) ×18 IMPLANT
SCREW SET KODIAK 5.5 X 40MM (Screws/anchors/cables) ×12 IMPLANT
SKIN PREP -BATH CLOTH CHG 2% (Prep Solutions) ×3 IMPLANT
SOLUTION IRR BAG .9% N/S 3000ML (Non-Pharmacy Meds/Solutions) ×3 IMPLANT
SOLUTION IRR POUR BTL 0.9% NS 1000ML (Non-Pharmacy Meds/Solutions) ×3 IMPLANT
SOLUTION IRR POUR BTL H20 1000ML (Non-Pharmacy Meds/Solutions) ×9 IMPLANT
SPONGE LAP RF DETECT 18" X 18" XRAY STERILE (Sponges) ×3 IMPLANT
STAPLER PROXIMATE SKIN 35 WIDE (Staplers and staple reloads) ×9 IMPLANT
STOCKINETTE 6X60 STERILE (Dressings/packing) ×3 IMPLANT
STYLET 40MM (Misc Surgical Supply) ×9 IMPLANT
SURGICAL PACK BASIC MAJOR SET-UP (Procedure Packs/kits) ×3 IMPLANT
SUTURE ETHILON 2-0 18" FS (Suture) ×3 IMPLANT
SUTURE MONOCRYL PLUS 2-0 27" CP-1 (Suture) ×9 IMPLANT
SUTURE MONOCRYL PLUS 3-0 27" PS-2 (MCP427) (Suture) ×6 IMPLANT
SUTURE VICRYL PLUS 0 18" CT-1 CR VCP840D (Suture) ×12 IMPLANT
SUTURE VICRYL PLUS 0 27" UR6 (VCP603) (Suture) ×4
SUTURE VICRYL PLUS 0 27" UR6 VCP603 (Suture) ×8 IMPLANT
SUTURE VICRYL PLUS 2-0 18" CT-1 (Suture) ×4
SUTURE VICRYL PLUS 2-0 18" CT-1 VCP839D (Suture) ×8 IMPLANT
SUTURE VICRYL PLUS 2-0 27" UR-6 VCP602 (Suture) ×12 IMPLANT
TAP EXTENDED MODULAR POLYAXIAL REDUCTION TULIP (Spine cages/spacers/discs/fixation) ×6 IMPLANT
TOWELS OR BLUE 4-PACK STERILE, DISPOSABLE (Drapes/towels) ×9 IMPLANT
TUBE YANKAUER FLEXIBLE TIP (Misc Medical Supply) ×3 IMPLANT
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
## 2019-12-29 NOTE — Progress Notes (Signed)
Attending Attestation:    I personally interviewed and examined the patient on 12/29/2019 , and I have reviewed the note by Dr. Allena Katz from 12/29/2019 .    I agree w/ the resident history, exam, assessment, and plan with the following additions:     Additional attending documentation:     Neuro: L brachial plexus injury   - to OR for exploration/repair    Unstable T8 - plan for fusion today   - MRI done today    Traumatic SAH - needs repeat CT head post op today  q1h neurochecks    CV: HD nl     Dennie Maizes
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
## 2019-12-29 NOTE — Interdisciplinary (Signed)
ROM knee brace unlocked applied to left lower extremity   Done in PACU2
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
## 2019-12-29 NOTE — Anesthesia Procedure Notes (Signed)
Arterial Line Procedure Note  Procedure: Arterial Line Insertion Date & Time: Date & Time: 12/29/2019 7:47 AM   Preparation: Patient was prepped and draped in usual sterile fashion  Indications:hemodynamic monitoring  Location: right radial Universal Protocol: Universal Protocol: Verbal consent obtained, risks and benefits discussed, patient states understanding of the procedure being performed, the patient's understanding of the procedure matches consent given, procedure consent matches procedure scheduled, relevant documents present and verified, test results available and properly labeled, site marked, imaging studies available, required blood products, implants, devices, and special equipment available and Immediately prior to procedure a time out was called to verify the correct patient, procedure, equipment, support staff and site/side marked as required  Consent given by: Consent given by: patient  Patient identity confirmed: Patient identity confirmed by: verbally with patient and arm band   Anesthesia:    Anesthetic total:  mL Sedation: patient sedated   Procedure Details: 20  Needle Gauge: 20    Ultrasound Guided Ultrasound Guided Procedure    Medications Administered at:  12/29/2019 7:47 AM  Post Procedure: dressing applied and patient tolerated the procedure well with no immediate complications  dressing applied   Comments:

## 2019-12-29 NOTE — Anesthesia Postprocedure Evaluation (Signed)
Anesthesia Post Note    Patient: Eric Fry    Procedure(s) Performed: Procedure(s):  Operative fixation left femur  Thoracic 7-Thoracic 9 Posterior instrumentation and fusion. Thoracic 8 Laminectomy      Final anesthesia type: General    Patient location: PACU    Post anesthesia pain: adequate analgesia    Mental status: awake, alert  and oriented    Airway Patent: Yes    Last Vitals:   Vitals Value Taken Time   BP 143/80 12/29/19 1415   Temp 36 C 12/29/19 1400   Pulse 89 12/29/19 1428   Resp 14 12/29/19 1428   SpO2 99 % 12/29/19 1428   Vitals shown include unvalidated device data.     Post vital signs: stable    Hydration: adequate    N/V:no    Anesthetic complications: no    Plan of care per primary team.
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
## 2019-12-29 NOTE — Procedures (Signed)
Eric Fry is a 29 year old adult patient.    ICD-10-CM ICD-9-CM   1. Closed displaced transverse fracture of shaft of left femur, initial encounter (CMS-HCC)  S72.322A 821.01   2. Closed unstable burst fracture of eighth thoracic vertebra with routine healing  S22.062D V54.17     No past medical history on file.  Blood pressure 112/65, pulse 119, temperature 97.9 F (36.6 C), resp. rate 11, height 6' (1.829 m), weight 113.4 kg (250 lb), SpO2 93 %.    Laceration Repair    Date/Time: 12/29/2019 4:50 AM  Performed by: Herminio Commons, MD  Authorized by: Maree Erie, MD, PhD   Consent: Verbal consent obtained.  Risks and benefits: risks, benefits and alternatives were discussed  Consent given by: patient  Patient identity confirmed: verbally with patient  Time out: Immediately prior to procedure a "time out" was called to verify the correct patient, procedure, equipment, support staff and site/side marked as required.  Body area: lower extremity (left shin)  Irrigation solution: saline  Irrigation method: jet lavage  Skin closure: 3-0 Prolene  Number of sutures: 5  Technique: simple  Approximation difficulty: simple  Patient tolerance: patient tolerated the procedure well with no immediate complications          Herminio Commons, MD  12/29/2019

## 2019-12-29 NOTE — Brief Op Note (Signed)
BRIEF OPERATIVE NOTE    DATE: 12/29/2019    PREOPERATIVE DIAGNOSIS:  T8 chance fracture    POSTOPERATIVE DIAGNOSIS:  Same    PROCEDURE:    Thoracic 7-Thoracic 9 Posterior instrumentation and fusion. Thoracic 8 Laminectomy    ATTENDING SURGEON:   Cline Cools):    Darletta Moll, Makalynn Berwanger    ANESTHESIA:     GETA     FINDINGS:      Intraoperative fluoroscopy, neuromonitoring. Signals stables.    WOUND CLASSIFICATION:   Wound Class: Class I (Clean) - Incision Closure: Deep and Superficial Layers    SPECIMENS:     None    IMPLANTS:       Implant Name Type Inv. Item Serial No. Manufacturer Lot No. LRB No. Used Action   11MM/TI CANN FRAN/GT 440/ LEFT      [id number] Left 1 Implanted   SCREW IM NAIL TI 5 X , LOCKING, T25 - [id number] Screws/anchors/cables SCREW IM NAIL TI 5 X , LOCKING, T25  [employer]  Left 1 Implanted   SCREW IM NAIL TI 5 X , LOCKING, T25 - [id number] Screws/anchors/cables SCREW IM NAIL TI 5 X , LOCKING, T25  [employer]  Left 2 Implanted   SCREW IM NAIL TI 5 X , LOCKING, T25 - [id number] Screws/anchors/cables SCREW IM NAIL TI 5 X , LOCKING, T25  [employer]  Left 1 Implanted   SCREW SET KODIAK 5.5 X - [id number] Screws/anchors/cables SCREW SET KODIAK 5.5 X  [employer]   4 Implanted   Cannulated Modular Screw Shank 5.70mm x 12mm       2 Implanted   TAP EXTENDED MODULAR POLYAXIAL REDUCTION TULIP - [id number] Spine cages/spacers/discs/fixation TAP EXTENDED MODULAR POLYAXIAL REDUCTION TULIP  [employer]   2 Implanted   SCREW SET KODIAK - [id number] Screws/anchors/cables SCREW SET KODIAK  [employer]   6 Implanted   ROD LORDOTIC 5.5 X - [id number] Spine cages/spacers/discs/fixation ROD LORDOTIC 5.5 X  [employer]   1 Implanted   ROD LORDOTIC TI MIS 5.5 X - [id number] Spine cages/spacers/discs/fixation ROD LORDOTIC TI MIS 5.5 X  [employer]   1 Implanted       Fluids/Blood Products:      IV Fluids:      2.5L LR, 1L  albumin    Blood Products:    None    EBL:      100cc    Urine Output:    1L    DRAINS:     1 7-flat subfascial JP to bulb suction    COMPLICATIONS:    None    PLAN:      Extubated, stable to PACU       q2 basic neuro checks       NS maintenance until tolerating PO       Hemogram now, then qd labs       IV APAP scheduled for pain, consider PCA, valium for spasm       ancef for surgical prophylaxis while drain in place       AP/lateral thoracic xrays prior to discharge    Maricela Curet, MD  Neurosurgery Resident  [facility] [phone], [facility] [phone]
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
## 2019-12-29 NOTE — Progress Notes (Signed)
I saw and examined the patient again this morning.    Overnight, the patient did obtain the MRIs of his neck and brain.    He is cleared from the standpoint of the neurosurgery and trauma teams for OR this AM as scheduled for operative fixation of left femur fracture. If he is stable he will then undergo fixation of his thoracic spine fracture with neurosurgery as part of the same anesthetic.    We will proceed to the OR as planned.    Please see my previous note/attestation from 12/27/28 and 12/28/28 for full details.    Pamala Duffel. Denyce Robert, M.D.  Assistant Clinical Professor  [facility]

## 2019-12-29 NOTE — Brief Op Note (Addendum)
BRIEF OPERATIVE NOTE    12/29/19  10:15 AM      PREOPERATIVE DIAGNOSIS:   Left displaced mid-shaft femur fracture  Left proximal fibula fracture    POSTOPERATIVE DIAGNOSIS:   Left displaced mid-shaft femur fracture  Left proximal fibula fracture with associated possible varus knee instability    PROCEDURE:   1. Left femur fracture open reduction and internal fixation with intramedullary nail  2. Exam left knee under anesthesia     ATTENDING SURGEON: Pamala Duffel. Denyce Robert, M.D.    ASSISTANTS(s): Jabier Gauss, MD; resident    ANESTHESIA: General    FINDINGS:   Unstable midshaft femur fracture, as expected  Left knee increased laxity to varus stress with a solid endpoint, concern for ligamentous injury    SPECIMENS: None    Fluids/Blood Products:      IV Fluids: See anesthesia record    Blood Products: None    EBL:    Urine Output: See anesthesia record; Foley placed    Tourniquet Time: None    IMPLANTS:   Implant Name Type Inv. Item Serial No. Manufacturer Lot No. LRB No. Used Action   11MM/TI CANN FRAN/GT 440/ LEFT      [id number] Left 1 Implanted   SCREW IM NAIL TI 5 X , LOCKING, T25 - [id number] Screws/anchors/cables SCREW IM NAIL TI 5 X , LOCKING, T25  [employer]  Left 1 Implanted   SCREW IM NAIL TI 5 X , LOCKING, T25 - [id number] Screws/anchors/cables SCREW IM NAIL TI 5 X , LOCKING, T25  [employer]  Left 2 Implanted   SCREW IM NAIL TI 5 X , LOCKING, T25 - [id number] Screws/anchors/cables SCREW IM NAIL TI 5 X , LOCKING, T25  [employer]  Left 1 Implanted       COMPLICATIONS: None    DISPOSITION: Stable when transferred over to Neurosurgery service for management of spine    POST-OP PLAN:  NWB LLE, knee ROM brace   MRI left knee when able  Dressing change in 2 days  Ancef x24 hours  DVT ppx per primary team  Pain control  Nutritional supplementation including Calcium, Vitamin D  Ortho to follow    Pamala Duffel. Denyce Robert, M.D.  Assistant Clinical Professor  Psychologist, clinical # [phone]

## 2019-12-30 ENCOUNTER — Inpatient Hospital Stay (HOSPITAL_BASED_OUTPATIENT_CLINIC_OR_DEPARTMENT_OTHER): Payer: TRICARE Prime—HMO

## 2019-12-30 DIAGNOSIS — S8012XA Contusion of left lower leg, initial encounter: Secondary | ICD-10-CM

## 2019-12-30 DIAGNOSIS — S83522A Sprain of posterior cruciate ligament of left knee, initial encounter: Secondary | ICD-10-CM

## 2019-12-30 DIAGNOSIS — S86802A Unspecified injury of other muscle(s) and tendon(s) at lower leg level, left leg, initial encounter: Secondary | ICD-10-CM

## 2019-12-30 DIAGNOSIS — S22062D Unstable burst fracture of T7-T8 vertebra, subsequent encounter for fracture with routine healing: Secondary | ICD-10-CM

## 2019-12-30 DIAGNOSIS — R590 Localized enlarged lymph nodes: Secondary | ICD-10-CM

## 2019-12-30 DIAGNOSIS — J8410 Pulmonary fibrosis, unspecified: Secondary | ICD-10-CM

## 2019-12-30 DIAGNOSIS — F10959 Alcohol use, unspecified with alcohol-induced psychotic disorder, unspecified: Secondary | ICD-10-CM

## 2019-12-30 DIAGNOSIS — S72322A Displaced transverse fracture of shaft of left femur, initial encounter for closed fracture: Secondary | ICD-10-CM

## 2019-12-30 DIAGNOSIS — S062X9A Diffuse traumatic brain injury with loss of consciousness of unspecified duration, initial encounter: Secondary | ICD-10-CM

## 2019-12-30 LAB — PHOSPHORUS, BLOOD: Phosphorous: 2.1 mg/dL — ABNORMAL LOW (ref 2.7–4.5)

## 2019-12-30 LAB — BASIC METABOLIC PANEL, BLOOD
Anion Gap: 9 mmol/L (ref 7–15)
BUN: 14 mg/dL (ref 8–23)
Bicarbonate: 25 mmol/L (ref 22–29)
Calcium: 8.7 mg/dL (ref 8.2–9.6)
Chloride: 108 mmol/L — ABNORMAL HIGH (ref 98–107)
Creatinine: 1.16 mg/dL (ref 0.67–1.17)
GFR: 56 mL/min
Glucose: 122 mg/dL — ABNORMAL HIGH (ref 70–99)
Potassium: 4.1 mmol/L (ref 3.5–5.1)
Sodium: 142 mmol/L (ref 136–145)

## 2019-12-30 LAB — CBC WITH DIFF, BLOOD
ANC-Automated: 3.9 10*3/uL (ref 1.6–7.0)
Abs Basophils: 0 10*3/uL
Abs Eosinophils: 0 10*3/uL (ref 0.0–0.5)
Abs Lymphs: 1 10*3/uL (ref 0.8–3.1)
Abs Monos: 0.6 10*3/uL (ref 0.2–0.8)
Basophils: 0 %
Eosinophils: 0 %
Hct: 24.5 %
Hgb: 8.4 gm/dL
Lymphocytes: 18 %
MCH: 31 pg (ref 26.0–32.0)
MCHC: 34.3 g/dL (ref 32.0–36.0)
MCV: 90.4 um3 (ref 79.0–95.0)
MPV: 9.9 fL (ref 9.4–12.4)
Monocytes: 12 %
Plt Count: 107 10*3/uL — ABNORMAL LOW (ref 140–370)
RBC: 2.71 10*6/uL
RDW: 14.1 % — ABNORMAL HIGH (ref 12.0–14.0)
Segs: 70 %
WBC: 5.6 10*3/uL (ref 4.0–10.0)

## 2019-12-30 LAB — MRSA SURVEILLANCE CULTURE

## 2019-12-30 LAB — MAGNESIUM, BLOOD: Magnesium: 2 mg/dL (ref 1.7–2.3)

## 2019-12-30 MED ORDER — OXYCODONE HCL 10 MG OR TABS
10.0000 mg | ORAL_TABLET | ORAL | Status: DC | PRN
Start: 2019-12-30 — End: 2020-01-02
  Administered 2019-12-31 – 2020-01-02 (×9): 10 mg via ORAL
  Filled 2019-12-30 (×9): qty 1

## 2019-12-30 MED ORDER — SODIUM PHOSPHATE 10 MEQ/50 ML NS (~~LOC~~)
10.0000 meq | Freq: Once | Status: AC
Start: 2019-12-30 — End: 2019-12-31
  Administered 2019-12-30: 23:00:00 10 meq via INTRAVENOUS
  Filled 2019-12-30: qty 50

## 2019-12-30 MED ORDER — OXYCODONE HCL 5 MG OR TABS
5.0000 mg | ORAL_TABLET | ORAL | Status: DC | PRN
Start: 2019-12-30 — End: 2020-01-02
  Administered 2019-12-31: 5 mg via ORAL
  Filled 2019-12-30: qty 1

## 2019-12-30 MED ORDER — HYDROMORPHONE HCL 1 MG/ML IJ SOLN
0.5000 mg | INTRAMUSCULAR | Status: AC | PRN
Start: 2019-12-30 — End: 2019-12-30
  Administered 2019-12-30: 0.5 mg via INTRAVENOUS
  Filled 2019-12-30: qty 0.5

## 2019-12-30 MED ORDER — SODIUM PHOSPHATE 10 MEQ/50 ML NS (~~LOC~~)
10.0000 meq | Status: AC
Start: 2019-12-30 — End: 2019-12-30
  Administered 2019-12-30 (×4): 10 meq via INTRAVENOUS
  Filled 2019-12-30 (×4): qty 50

## 2019-12-30 NOTE — Plan of Care (Signed)
Problem: Promotion of Health and Safety  Goal: Promotion of Health and Safety  Description: The patient remains safe, receives appropriate treatment and achieves optimal outcomes (physically, psychosocially, and spiritually) within the limitations of the disease process by discharge.    Information below is the current care plan.  Outcome: Progressing  Flowsheets  Taken 12/30/2019 1743  Guidelines: Inpatient Nursing Guidelines  Individualized Interventions/Recommendations #1: encouraged patient's family to bring in his cell phone and headphone for some distraction  Individualized Interventions/Recommendations #2 (if applicable): premedicate for dressing changes  Individualized Interventions/Recommendations #3 (if applicable): pt. likes cranberry and [place] juice, pt. encourage to drink fluid to advance diet  Individualized Interventions/Recommendations #4 (if applicable): place patient table on right side, left arm is immbolized  Outcome Evaluation (rationale for progressing/not progressing) every shift: Pt. remains neuro intact, but withdrawn and minimally interactive with staff and family.  VSS.  Pt. had voiding trial but was retaining urine, orders received to place foley cath.  Pt. tolerated well.  Transport to [employer], and MRI for continued imagining of LUE injury and L knee injury.  Plastics consult today for suspected brachial plexus injury to LUE.  Resident updated patient mother over the phone.  Taken 12/30/2019 0800  Patient /Family stated Goal: I want to get out of bed
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
## 2019-12-30 NOTE — Interdisciplinary (Signed)
PT Contact     Row Name 12/30/19 0925       Therapy Contact Note    Contact Time  0926    Therapy not provided at this time as  Other (comment)    Additional Comments  Full spine precautions, please update progressive mobility for PT intervention for OOB.
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
## 2019-12-30 NOTE — Interdisciplinary (Signed)
OTContact     Row Name 12/30/19 0841       Therapy Contact Note    Contact Time  0841    Therapy not provided at this time as  Other (comment)    Additional Comments  pt with full spine prec. Please update progressive mobility when appropriate to participate in OT.
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
## 2019-12-31 ENCOUNTER — Inpatient Hospital Stay (HOSPITAL_BASED_OUTPATIENT_CLINIC_OR_DEPARTMENT_OTHER): Payer: TRICARE Prime—HMO

## 2019-12-31 DIAGNOSIS — S062X1D Diffuse traumatic brain injury with loss of consciousness of 30 minutes or less, subsequent encounter: Secondary | ICD-10-CM

## 2019-12-31 DIAGNOSIS — Z7409 Other reduced mobility: Secondary | ICD-10-CM

## 2019-12-31 DIAGNOSIS — Z981 Arthrodesis status: Secondary | ICD-10-CM

## 2019-12-31 DIAGNOSIS — Z789 Other specified health status: Secondary | ICD-10-CM

## 2019-12-31 DIAGNOSIS — S52615D Nondisplaced fracture of left ulna styloid process, subsequent encounter for closed fracture with routine healing: Secondary | ICD-10-CM

## 2019-12-31 DIAGNOSIS — S066X1D Traumatic subarachnoid hemorrhage with loss of consciousness of 30 minutes or less, subsequent encounter: Secondary | ICD-10-CM

## 2019-12-31 DIAGNOSIS — S72322D Displaced transverse fracture of shaft of left femur, subsequent encounter for closed fracture with routine healing: Secondary | ICD-10-CM

## 2019-12-31 DIAGNOSIS — G8911 Acute pain due to trauma: Secondary | ICD-10-CM

## 2019-12-31 DIAGNOSIS — R2689 Other abnormalities of gait and mobility: Secondary | ICD-10-CM

## 2019-12-31 DIAGNOSIS — J984 Other disorders of lung: Secondary | ICD-10-CM

## 2019-12-31 LAB — PREPARE/CROSSMATCH PRBCS
Barcoded ABO/RH: 1700
Barcoded ABO/RH: 1700
Expiration: 202106022359
Expiration: 202106032359
Expiration: 202106032359
Type: B NEG
Type: B NEG
Type: B NEG

## 2019-12-31 LAB — GLUCOSE (POCT): Glucose (POCT): 72 mg/dL (ref 70–99)

## 2019-12-31 LAB — HEPATITIS C RNA, QUANT BLOOD: Hepatitis C RNA Quant: NOT DETECTED [IU]/mL

## 2019-12-31 MED ORDER — COVID-19 AD26 VACCINE(JANSSEN) 0.5 ML IM SUSP
0.5000 mL | INTRAMUSCULAR | Status: AC
Start: 2019-12-31 — End: 2019-12-31
  Administered 2019-12-31 (×2): 0.5 mL via INTRAMUSCULAR
  Filled 2019-12-31: qty 0.5

## 2019-12-31 NOTE — Progress Notes (Signed)
MRI reviewed and discussed with ortho sports staff. Will plan for WBAT in knee ROM brace locked in extension for 4 weeks. Follow up in ortho sports clinic 2 weeks post discharge. Orthopaedics to sign off at this time.
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
## 2019-12-31 NOTE — Progress Notes (Deleted)
[place] [facility]  Orthopaedic Progress Note    Current Hospital Stay:   3 days - Admitted on: 12/28/2019    ID:  Left femoral shaft fracture - s/p IM nail 12/29/2019  Left knee laceration r/o traumatic arthrotomy - negative saline load 12/29/2019  Left ulnar styloid fracture - nonop splinted in supination  Left flail upper extremity -possible brachial plexus injury  T8 bony chance fracture - s/p Fusion and Laminectomy 12/29/2019    Subjective:   Patient sleeping in bed. Somewhat alert. Answering questions shortly.     Objective:   Temperature:  [98 F (36.7 C)-100.9 F (38.3 C)] 99.6 F (37.6 C) (06/01 1200)  Blood pressure (BP): (138-162)/(52-88) 158/69 (06/01 1400)  Heart Rate:  [64-103] 103 (06/01 1400)  Respirations:  [14-24] 19 (06/01 1400)  Pain Score: NA (pre med, non-pain or scheduled) (06/01 1426)  O2 Device: None (Room air) (06/01 1400)  SpO2:  [93 %-100 %] 94 % (06/01 1400)    Intake/Output Summary (Last 24 hours) at 12/29/2019 2151  Last data filed at 12/29/2019 1831  Gross per 24 hour   Intake 4425 ml   Output 2365 ml   Net 2060 ml       Physical Exam:   General: no acute distress  Pulm: non labored breathing    Left Lower Extremity:   Knee brace and dressing intact. Dressings removed to reveal wounds c/d/i. Post op left hip dressings intact, minimal strikethrough  Compartments Soft and compressible.   - Motor: Fires TA, GSc, EHL, FHL  - Sensory: SILT s/s/sp/dp/t distributions  - Cap refill <2s foot wwp wwp    Pelvis: no tenderness on AP or rotational stress of pelvis; no TTP at symphysis      Labs:  Lab Results   Component Value Date    WBC 5.6 12/30/2019    RBC 2.71 12/30/2019    HGB 8.4 12/30/2019    HCT 24.5 12/30/2019    MCV 90.4 12/30/2019    MCHC 34.3 12/30/2019    RDW 14.1 (H) 12/30/2019    PLT 107 (L) 12/30/2019    MPV 9.9 12/30/2019         Imaging:   MRI of left knee demonstrates no evidence of ligamentous injury to ACL, PCL, MCL. No evidence of acute meniscal tear.      Assessment:  Eric Fry is a 42M with L femoral shaft fracture s/p IM nail and traumatic knee laceration s/p irrigation and debridement 12/29/19. Dressings changed on rounds this morning.     Plan/Recs:  - Left femoral shaft fracture S/P IM nail 12/29/19  - Left knee laceration - SLT negative, primarily closed 12/29/19 laceration, sutures out 2 weeks (01/11/20)  Left ulnar styloid fx, poss DRUJ injury- non-op, per ortho hand  T8 bony chance fx  - care per NSGY, s/p fusion and lami  Left Brachial Plexus Injury - care per Hand/Plastics    WBAT LLE, knee ROM brace   DVT ppx per primary team  Pain control  Nutritional supplementation including Calcium, Vitamin D  Ortho Trauma to sign off  Follow up in ortho trauma clinic in 2 weeks.     Page Gaylord Shih TRAUMA FLOOR at [phone] with questions or concerns    Gabrielle Dare  Orthopaedic Surgery Resident  [facility]

## 2019-12-31 NOTE — Plan of Care (Addendum)
Problem: Promotion of Health and Safety  Goal: Promotion of Health and Safety  Description: The patient remains safe, receives appropriate treatment and achieves optimal outcomes (physically, psychosocially, and spiritually) within the limitations of the disease process by discharge.    Information below is the current care plan.  Outcome: Progressing  Flowsheets  Taken 12/31/2019 1534 by Sigmund Hazel, RN  Guidelines: Inpatient Nursing Guidelines  Outcome Evaluation (rationale for progressing/not progressing) every shift: Patient remains without sensation to left arm. Answers orientation questions appropriately but still demonstrating confusion, asking when he would see the physical therapist after just having worked with them and asking what time he was going home. Needs frequent reeducation about plan of care. Encouraged to use IS throughout the day, reaching up to 500  Taken 12/30/2019 1743 by Elsie Ra, RN  Individualized Interventions/Recommendations #3 (if applicable): pt. likes cranberry and Lamoni juice, pt. encourage to drink fluid to advance diet  Individualized Interventions/Recommendations #4 (if applicable): place patient table on right side, left arm is immbolized

## 2019-12-31 NOTE — Interdisciplinary (Signed)
Spiritual Care Note    Date of Spiritual Care Visit: 12/31/19  Referred By: Patient Themselves    Spiritual Screening Provided: Yes Marilynne Drivers)  Date of Spiritual Screening: 12/31/19    Assessment: Pt expressed wanting to get home as soon as possible. He was drowsy during visit and reported no needs currently and is aware of chaplain availability.     Interventions: [facility], Spiritual Assessment, Listening    Oriented to Spiritual Care. Created "community of two" with patient, keeping patient company.     Outcomes: Gratitude  Will Spiritual Care provide a Follow-Up Visit? Yes  Follow-Up Details: Spiritual Care will continue to follow.    Dwyane Dee, Chaplain  12/31/2019 12:04 PM    To request Spiritual Care services, please place an order for "IP Consult to Spiritual Care".  For urgent/STAT requests, please also page [phone].

## 2019-12-31 NOTE — Progress Notes (Addendum)
[place] [facility]  Orthopaedic Progress Note    Current Hospital Stay:   3 days - Admitted on: 12/28/2019    ID:  Left femoral shaft fracture - s/p IM nail 12/29/2019  Left knee laceration r/o traumatic arthrotomy - negative saline load 12/29/2019  Left ulnar styloid fracture - nonop splinted in supination  Left flail upper extremity -possible brachial plexus injury  T8 bony chance fracture - s/p Fusion and Laminectomy 12/29/2019    Subjective:   Patient sleeping in bed. Somewhat alert. Answering questions shortly.     Objective:   Temperature:  [98 F (36.7 C)-100.9 F (38.3 C)] 99.6 F (37.6 C) (06/01 0524)  Blood pressure (BP): (113-162)/(52-99) 138/58 (06/01 0400)  Heart Rate:  [64-106] 79 (06/01 0524)  Respirations:  [14-24] 22 (06/01 0524)  Pain Score: Patient Sleeping, Respiratory Assessment Done (06/01 0400)  O2 Device: None (Room air) (06/01 0400)  SpO2:  [94 %-99 %] 95 % (06/01 0400)    Intake/Output Summary (Last 24 hours) at 12/29/2019 2151  Last data filed at 12/29/2019 1831  Gross per 24 hour   Intake 4425 ml   Output 2365 ml   Net 2060 ml       Physical Exam:   General: no acute distress  Pulm: non labored breathing    Left Lower Extremity:   Knee brace and dressing intact. Dressings removed to reveal wounds c/d/i. Post op left hip dressings intact, minimal strikethrough  Compartments Soft and compressible.   - Motor: Fires TA, GSc, EHL, FHL  - Sensory: SILT s/s/sp/dp/t distributions  - Cap refill <2s foot wwp wwp    Pelvis: no tenderness on AP or rotational stress of pelvis; no TTP at symphysis      Labs:  Lab Results   Component Value Date    WBC 5.6 12/30/2019    RBC 2.71 12/30/2019    HGB 8.4 12/30/2019    HCT 24.5 12/30/2019    MCV 90.4 12/30/2019    MCHC 34.3 12/30/2019    RDW 14.1 (H) 12/30/2019    PLT 107 (L) 12/30/2019    MPV 9.9 12/30/2019         Imaging:   MRI of left knee demonstrates no evidence of ligamentous injury to ACL, PCL, MCL. No evidence of acute meniscal tear.      Assessment:  Eric Fry is a 70M with L femoral shaft fracture and traumatic knee arthrotomy s/p IM nail and irrigation and debridement 12/29/19. Dressings changed on rounds this morning.     Plan/Recs:  - Left femoral shaft fracture S/p IM nail 12/29/19  - Left knee laceration - SLT negative, primarily closed 12/29/19 laceration, sutures out 2 weeks (12Jun)  Left ulnar styloid fx, poss DRUJ injury- non-op, per ortho hand  T8 bony chance fx  - care per NSGY, s/p fusion and lami  Left Brachial Plexus Injury - care per Hand/Plastics    WBAT LLE, knee ROM brace   DVT ppx per primary team  Pain control  Nutritional supplementation including Calcium, Vitamin D  Ortho Trauma to sign off  Follow up in ortho trauma clinic in 2 weeks.     Page Gaylord Shih TRAUMA FLOOR at [phone] with questions or concerns    Gabrielle Dare  Orthopaedic Surgery Resident  [place] [facility]
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
## 2020-01-01 ENCOUNTER — Inpatient Hospital Stay (HOSPITAL_BASED_OUTPATIENT_CLINIC_OR_DEPARTMENT_OTHER): Payer: TRICARE Prime—HMO

## 2020-01-01 DIAGNOSIS — R2241 Localized swelling, mass and lump, right lower limb: Secondary | ICD-10-CM

## 2020-01-01 LAB — BASIC METABOLIC PANEL, BLOOD
Anion Gap: 10 mmol/L (ref 7–15)
BUN: 12 mg/dL (ref 6–20)
Bicarbonate: 27 mmol/L (ref 22–29)
Calcium: 9 mg/dL (ref 8.5–10.6)
Chloride: 105 mmol/L (ref 98–107)
Creatinine: 1.03 mg/dL (ref 0.67–1.17)
GFR: >60 mL/min
Glucose: 100 mg/dL — ABNORMAL HIGH (ref 70–99)
Potassium: 3.8 mmol/L (ref 3.5–5.1)
Sodium: 142 mmol/L (ref 136–145)

## 2020-01-01 LAB — CBC WITH DIFF, BLOOD
ANC-Automated: 3.7 10*3/uL (ref 1.6–7.0)
Abs Basophils: 0 10*3/uL
Abs Eosinophils: 0.1 10*3/uL (ref 0.0–0.5)
Abs Lymphs: 1 10*3/uL (ref 0.8–3.1)
Abs Monos: 0.5 10*3/uL (ref 0.2–0.8)
Basophils: 0 %
Eosinophils: 2 %
Hct: 23.7 % — ABNORMAL LOW (ref 40.0–50.0)
Hgb: 8 gm/dL — ABNORMAL LOW (ref 13.7–17.5)
Lymphocytes: 19 %
MCH: 30.4 pg (ref 26.0–32.0)
MCHC: 33.8 g/dL (ref 32.0–36.0)
MCV: 90.1 um3 (ref 79.0–95.0)
MPV: 9.6 fL (ref 9.4–12.4)
Monocytes: 9 %
Plt Count: 143 10*3/uL (ref 140–370)
RBC: 2.63 10*6/uL — ABNORMAL LOW (ref 4.60–6.10)
RDW: 13.7 % (ref 12.0–14.0)
Segs: 69 %
WBC: 5.3 10*3/uL (ref 4.0–10.0)

## 2020-01-01 LAB — MAGNESIUM, BLOOD: Magnesium: 2 mg/dL (ref 1.6–2.6)

## 2020-01-01 LAB — PHOSPHORUS, BLOOD: Phosphorous: 3.2 mg/dL (ref 2.7–4.5)

## 2020-01-01 MED ORDER — MORPHINE SULFATE 4 MG/ML IJ SOLN
4.0000 mg | INTRAMUSCULAR | Status: DC | PRN
Start: 2020-01-01 — End: 2020-01-02
  Administered 2020-01-02: 4 mg via INTRAVENOUS
  Filled 2020-01-01: qty 1

## 2020-01-01 MED ORDER — LEVETIRACETAM 1000 MG OR TABS
1000.0000 mg | ORAL_TABLET | Freq: Two times a day (BID) | ORAL | Status: DC
Start: 2020-01-01 — End: 2020-01-02
  Administered 2020-01-01 – 2020-01-02 (×4): 1000 mg via ORAL
  Filled 2020-01-01 (×3): qty 1

## 2020-01-01 MED ORDER — SENNA 8.6 MG OR TABS
2.0000 | ORAL_TABLET | Freq: Every evening | ORAL | Status: DC
Start: 2020-01-01 — End: 2020-01-02
  Administered 2020-01-01 (×2): 17.2 mg via ORAL
  Filled 2020-01-01: qty 2

## 2020-01-01 MED ORDER — LIDOCAINE HCL 1 % IJ SOLN
5.0000 mL | Freq: Once | INTRAMUSCULAR | Status: AC
Start: 2020-01-01 — End: 2020-01-01
  Administered 2020-01-01: 5 mL via INTRADERMAL
  Filled 2020-01-01: qty 5

## 2020-01-01 MED ORDER — TAMSULOSIN HCL 0.4 MG PO CAPS
0.4000 mg | ORAL_CAPSULE | Freq: Once | ORAL | Status: AC
Start: 2020-01-01 — End: 2020-01-01
  Administered 2020-01-01: 0.4 mg via ORAL
  Filled 2020-01-01: qty 1

## 2020-01-01 MED ORDER — ONDANSETRON HCL 4 MG/2ML IV SOLN
4.0000 mg | Freq: Three times a day (TID) | INTRAMUSCULAR | Status: DC | PRN
Start: 2020-01-01 — End: 2020-01-02
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
## 2020-01-01 NOTE — Interdisciplinary (Signed)
Procedure: [facility] TLSO  [phone]) w/ Anterior Thoracic Extension [phone])  applied      Location: Spine  Instruction/Education Provided: yes    Ordering Physician: NP [place], Nicola Girt, NP

## 2020-01-01 NOTE — Interdisciplinary (Signed)
Spiritual Care Note    Date of Spiritual Care Visit: 01/01/20  Referred By: Patient Themselves    Spiritual Screening Provided: Yes (None)  Date of Spiritual Screening: 01/01/20    Assessment: Pt expressed feeling sad for his current condition. Pt has complex insight with his emotions stating that "sadness is a part of our human experience" and continues to cope well. He voiced feeling supported by his family and was able to facetime with his two children. He expressed feeling happy to see them, but also did not want his children to see the extent of his injuries. 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 He deeply appreciates social support and opportunities to share his story stating, "I am not religious, but conversation helps."    Interventions: [facility], Listening, Spiritual Assessment, Discuss spiritual resources/coping, Emotional support    Created "community of two" with pt, keeping him company and listening attentively to his story. Made genuine statements of affirmation about pt's behavior. Acknowledged sadness    Outcomes: Gratitude, Identified spiritual concern, Restored community  Will Spiritual Care provide a Follow-Up Visit? Yes  Follow-Up Details: Spiritual Care will continue to follow as pt expressed desire for a follow-up visit.    Dwyane Dee, Chaplain  01/01/2020 2:30 PM    To request Spiritual Care services, please place an order for "IP Consult to Spiritual Care".  For urgent/STAT requests, please also page [phone].
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
## 2020-01-01 NOTE — Plan of Care (Signed)
Problem: Promotion of Health and Safety  Goal: Promotion of Health and Safety  Description: The patient remains safe, receives appropriate treatment and achieves optimal outcomes (physically, psychosocially, and spiritually) within the limitations of the disease process by discharge.    Information below is the current care plan.  Outcome: Progressing  Flowsheets  Taken 01/01/2020 0302 by Trellis Paganini, RN  Individualized Interventions/Recommendations #1: Pain control - scheduled and prn meds  Individualized Interventions/Recommendations #2 (if applicable): Q2H turns to promote skin integrity, elevate limbs appropriately, urinal within reach  Individualized Interventions/Recommendations #3 (if applicable): Neurovasc reassess - LUE without movement nor sensation at baseline, wounds assessed, drsgs changed  Individualized Interventions/Recommendations #4 (if applicable): Encourage pt to participate in care, promote more independence - keep bedside table on R side  Individualized Interventions/Recommendations #5 (if applicable): Cluster care to promote rest  Outcome Evaluation (rationale for progressing/not progressing) every shift: Patient remains AOx4, VSS, afebrile, able to make needs known. Pain controlled with prn oxy, repositioning. Neuro intact. Calls appropriately when needs help. Diet upgraded to regular, tolerating with increased appetite. Skin assessed, turned frequently, hourly rounding to ensure pt safety. Drsgs changed. No acute s/s of distress, will monitor.  Taken 12/31/2019 1910 by Trellis Paganini, RN  Patient /Family stated Goal: Pain control  Taken 12/31/2019 1534 by Sigmund Hazel, RN  Guidelines: Inpatient Nursing Guidelines

## 2020-01-01 NOTE — Plan of Care (Signed)
Problem: Promotion of Health and Safety  Goal: Promotion of Health and Safety  Description: The patient remains safe, receives appropriate treatment and achieves optimal outcomes (physically, psychosocially, and spiritually) within the limitations of the disease process by discharge.    Information below is the current care plan.  Outcome: Progressing  Flowsheets (Taken 01/01/2020 1715)  Outcome Evaluation (rationale for progressing/not progressing) every shift: A&Ox4. No sensation or movement to LUE. Moves all other extremities. Dangled with PT at bedside. Dressing change to L shoulder and R arm. C-collar on at all times. TLSO when OOB for comfort. LLE flexed when OOB. Neuro exam unchanged. Voiding in urinal. Universal fall precautions remain in place. Call bell within reach. Frequent rounding. Safety maintained.
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
## 2020-01-02 DIAGNOSIS — S134XXA Sprain of ligaments of cervical spine, initial encounter: Secondary | ICD-10-CM

## 2020-01-02 DIAGNOSIS — S062X9A Diffuse traumatic brain injury with loss of consciousness of unspecified duration, initial encounter: Secondary | ICD-10-CM

## 2020-01-02 DIAGNOSIS — S129XXA Fracture of neck, unspecified, initial encounter: Secondary | ICD-10-CM

## 2020-01-02 DIAGNOSIS — R339 Retention of urine, unspecified: Secondary | ICD-10-CM

## 2020-01-02 DIAGNOSIS — S22060A Wedge compression fracture of T7-T8 vertebra, initial encounter for closed fracture: Secondary | ICD-10-CM

## 2020-01-02 DIAGNOSIS — S143XXA Injury of brachial plexus, initial encounter: Secondary | ICD-10-CM

## 2020-01-02 DIAGNOSIS — T07XXXA Unspecified multiple injuries, initial encounter: Secondary | ICD-10-CM

## 2020-01-02 DIAGNOSIS — S0636AA Traumatic hemorrhage of cerebrum, unspecified, with loss of consciousness status unknown, initial encounter (CMS-HCC): Secondary | ICD-10-CM

## 2020-01-02 DIAGNOSIS — S064XAA Epidural hemorrhage with loss of consciousness status unknown, initial encounter (CMS-HCC): Secondary | ICD-10-CM

## 2020-01-02 DIAGNOSIS — N319 Neuromuscular dysfunction of bladder, unspecified: Secondary | ICD-10-CM

## 2020-01-02 DIAGNOSIS — D62 Acute posthemorrhagic anemia: Secondary | ICD-10-CM

## 2020-01-02 DIAGNOSIS — K5900 Constipation, unspecified: Secondary | ICD-10-CM

## 2020-01-02 DIAGNOSIS — M12562 Traumatic arthropathy, left knee: Secondary | ICD-10-CM

## 2020-01-02 LAB — BASIC METABOLIC PANEL, BLOOD
Anion Gap: 11 mmol/L (ref 7–15)
BUN: 13 mg/dL (ref 6–20)
Bicarbonate: 24 mmol/L (ref 22–29)
Calcium: 9 mg/dL (ref 8.5–10.6)
Chloride: 104 mmol/L (ref 98–107)
Creatinine: 1.02 mg/dL (ref 0.67–1.17)
GFR: >60 mL/min
Glucose: 110 mg/dL — ABNORMAL HIGH (ref 70–99)
Potassium: 4 mmol/L (ref 3.5–5.1)
Sodium: 139 mmol/L (ref 136–145)

## 2020-01-02 LAB — CBC WITH DIFF, BLOOD
ANC-Automated: 3.2 10*3/uL (ref 1.6–7.0)
Abs Basophils: 0 10*3/uL
Abs Eosinophils: 0.1 10*3/uL (ref 0.0–0.5)
Abs Lymphs: 0.9 10*3/uL (ref 0.8–3.1)
Abs Monos: 0.6 10*3/uL (ref 0.2–0.8)
Basophils: 0 %
Eosinophils: 2 %
Hct: 22.1 % — ABNORMAL LOW (ref 40.0–50.0)
Hgb: 7.9 gm/dL — ABNORMAL LOW (ref 13.7–17.5)
Imm Gran %: 1 % (ref <1)
Lymphocytes: 19 %
MCH: 31.6 pg (ref 26.0–32.0)
MCHC: 35.7 g/dL (ref 32.0–36.0)
MCV: 88.4 um3 (ref 79.0–95.0)
MPV: 9.5 fL (ref 9.4–12.4)
Monocytes: 12 %
Plt Count: 170 10*3/uL (ref 140–370)
RBC: 2.5 10*6/uL — ABNORMAL LOW (ref 4.60–6.10)
RDW: 13.5 % (ref 12.0–14.0)
Segs: 66 %
WBC: 4.8 10*3/uL (ref 4.0–10.0)

## 2020-01-02 LAB — PHOSPHORUS, BLOOD: Phosphorous: 3.8 mg/dL (ref 2.7–4.5)

## 2020-01-02 LAB — MAGNESIUM, BLOOD: Magnesium: 2 mg/dL (ref 1.6–2.6)

## 2020-01-02 MED ORDER — GABAPENTIN 300 MG OR CAPS
300.0000 mg | ORAL_CAPSULE | Freq: Three times a day (TID) | ORAL | 0 refills | Status: AC
Start: 2020-01-02 — End: ?

## 2020-01-02 MED ORDER — SENNA 8.6 MG OR TABS
17.2000 mg | ORAL_TABLET | Freq: Two times a day (BID) | ORAL | 0 refills | Status: AC
Start: 2020-01-02 — End: ?

## 2020-01-02 MED ORDER — LIDOCAINE 4 % EX PTCH
1.0000 | MEDICATED_PATCH | CUTANEOUS | 0 refills | Status: AC
Start: 2020-01-02 — End: ?

## 2020-01-02 MED ORDER — OXYCODONE HCL 10 MG OR TABS
10.0000 mg | ORAL_TABLET | ORAL | 0 refills | Status: AC | PRN
Start: 2020-01-02 — End: ?

## 2020-01-02 MED ORDER — LEVETIRACETAM 1000 MG OR TABS
1000.0000 mg | ORAL_TABLET | Freq: Two times a day (BID) | ORAL | 0 refills | Status: AC
Start: 2020-01-02 — End: 2020-01-05

## 2020-01-02 MED ORDER — ENOXAPARIN SODIUM 40 MG/0.4ML SC SOLN
40.0000 mg | Freq: Two times a day (BID) | SUBCUTANEOUS | Status: DC
Start: 2020-01-02 — End: 2020-01-02
  Administered 2020-01-02: 13:00:00 40 mg via SUBCUTANEOUS
  Filled 2020-01-02: qty 1

## 2020-01-02 MED ORDER — ACETAMINOPHEN 325 MG PO TABS
975.0000 mg | ORAL_TABLET | Freq: Three times a day (TID) | ORAL | 0 refills | Status: AC
Start: 2020-01-02 — End: 2020-12-27

## 2020-01-02 MED ORDER — ENOXAPARIN SODIUM 40 MG/0.4ML SC SOLN
40.0000 mg | Freq: Two times a day (BID) | SUBCUTANEOUS | 0 refills | Status: AC
Start: 2020-01-02 — End: ?

## 2020-01-02 MED ORDER — OXYCODONE HCL 5 MG OR TABS
5.0000 mg | ORAL_TABLET | ORAL | 0 refills | Status: AC | PRN
Start: 2020-01-02 — End: ?

## 2020-01-02 MED ORDER — BACITRACIN-POLYMYXIN B 500-10000 UNIT/GM EX OINT
1.0000 | TOPICAL_OINTMENT | Freq: Two times a day (BID) | CUTANEOUS | 0 refills | Status: AC
Start: 2020-01-02 — End: ?
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
## 2020-01-02 NOTE — Interdisciplinary (Signed)
Spiritual Care Note    Date of Spiritual Care Visit: 01/02/20  Referred By: Patient Themselves    Spiritual Screening Provided: Yes (None)  Date of Spiritual Screening: 01/01/20    Assessment: Follow up visit with pt. Pt's mother Erskine Squibb and sister Delight Hoh at bedside. Erskine Squibb expressed that many people are praying for him and that they are a praying family. She expressed that "we will get through this together." Pt has strong social support and expressed gratitude for family. Pt voiced feeling happy when I said I was happy to see him and is overall has maintained sense of humor and strong sense of self.      Interventions: Ministry of Presence, Prayer/Blessing, Listening, Spiritual Assessment    Provided blessing for pt and family. Listened attentively to pt and family. Made genuine statements of affirmation about pt's behavior.    Outcomes: Gratitude  Will Spiritual Care provide a Follow-Up Visit? No  Follow-Up Details: Spiritual Care team remains available to support patient and family, as needed.     Dwyane Dee, Chaplain  01/02/2020 5:37 PM    To request Spiritual Care services, please place an order for "IP Consult to Spiritual Care".  For urgent/STAT requests, please also page [phone].
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
## 2020-01-02 NOTE — Plan of Care (Signed)
Problem: Promotion of Health and Safety  Goal: Promotion of Health and Safety  Description: The patient remains safe, receives appropriate treatment and achieves optimal outcomes (physically, psychosocially, and spiritually) within the limitations of the disease process by discharge.    Information below is the current care plan.  01/02/2020 1455 by Lajean Silvius, RN  Outcome: Progressing  Flowsheets  Taken 01/02/2020 1455 by Lajean Silvius, RN  Guidelines: Inpatient Nursing Guidelines  Individualized Interventions/Recommendations #1: neurobowel program.  Individualized Interventions/Recommendations #3 (if applicable): multidisciplinary team--active duty.  pt to go to [facility] for acute rehab.  Individualized Interventions/Recommendations #4 (if applicable): involve patient's mother and sister in ADLs.  Individualized Interventions/Recommendations #5 (if applicable): Pain control, seizure and fall and precaution.  Outcome Evaluation (rationale for progressing/not progressing) every shift: pt has been alert/awake/oriented x4.  pleasant.  family members are actively helping with needs such as feeding and repositioning. (mother wants to help with lifting even though when asked that staff can do it).  possible transfer to [facility].  no seizure activities.  pain is well controlled with current regimen.  Taken 01/02/2020 0600 by Lajean Silvius, RN  Patient /Family stated Goal: working with therapists  Taken 01/01/2020 0302 by Trellis Paganini, RN  Individualized Interventions/Recommendations #2 (if applicable): Q2H turns to promote skin integrity, elevate limbs appropriately, urinal within reach  01/02/2020 1213 by Lajean Silvius, RN  Outcome: Progressing

## 2020-01-02 NOTE — Interdisciplinary (Signed)
Called and given report to [facility] RN, Shanda Bumps.

## 2020-01-02 NOTE — Plan of Care (Signed)
Problem: Promotion of Health and Safety  Goal: Promotion of Health and Safety  Description: The patient remains safe, receives appropriate treatment and achieves optimal outcomes (physically, psychosocially, and spiritually) within the limitations of the disease process by discharge.    Information below is the current care plan.  01/02/2020 1926 by Lajean Silvius, RN  Outcome: Discharged  Flowsheets (Taken 01/02/2020 1926)  Patient /Family stated Goal: discharged to [facility] by gurney.  01/02/2020 1455 by Lajean Silvius, RN  Outcome: Progressing  Flowsheets  Taken 01/02/2020 1455 by Lajean Silvius, RN  Guidelines: Inpatient Nursing Guidelines  Individualized Interventions/Recommendations #1: neurobowel program.  Individualized Interventions/Recommendations #3 (if applicable): multidisciplinary team--active duty.  pt to go to [facility] for acute rehab.  Individualized Interventions/Recommendations #4 (if applicable): involve patient's mother and sister in ADLs.  Individualized Interventions/Recommendations #5 (if applicable): Pain control, seizure and fall and precaution.  Outcome Evaluation (rationale for progressing/not progressing) every shift: pt has been alert/awake/oriented x4.  pleasant.  family members are actively helping with needs such as feeding and repositioning. (mother wants to help with lifting even though when asked that staff can do it).  possible transfer to [facility].  no seizure activities.  pain is well controlled with current regimen.  Taken 01/02/2020 0600 by Lajean Silvius, RN  Patient /Family stated Goal: working with therapists  Taken 01/01/2020 0302 by Trellis Paganini, RN  Individualized Interventions/Recommendations #2 (if applicable): Q2H turns to promote skin integrity, elevate limbs appropriately, urinal within reach  01/02/2020 1213 by Lajean Silvius, RN  Outcome: Progressing
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
## 2020-01-02 NOTE — Plan of Care (Addendum)
Problem: Promotion of Health and Safety  Goal: Promotion of Health and Safety  Description: The patient remains safe, receives appropriate treatment and achieves optimal outcomes (physically, psychosocially, and spiritually) within the limitations of the disease process by discharge.    Information below is the current care plan.  Outcome: Progressing  Flowsheets  Taken 01/02/2020 0413 by Trellis Paganini, RN  Outcome Evaluation (rationale for progressing/not progressing) every shift: Remains AOx4, VSS, afebrile, able to make needs known. Pain controlled with prn oxy, repositioning, calls when needs help. Skin assessed, turned frequently, hourly rounding. Drsgs changed. Voiding in urinal, needs encouragement, amber to [place] output. No acute s/s of distress, will monitor.  Taken 01/01/2020 2000 by Trellis Paganini, RN  Patient /Family stated Goal: Good sleep and no pain please  Taken 01/01/2020 0302 by Trellis Paganini, RN  Individualized Interventions/Recommendations #1: Pain control - scheduled and prn meds  Individualized Interventions/Recommendations #2 (if applicable): Q2H turns to promote skin integrity, elevate limbs appropriately, urinal within reach  Individualized Interventions/Recommendations #3 (if applicable): Neurovasc reassess - LUE without movement nor sensation at baseline, wounds assessed, drsgs changed  Individualized Interventions/Recommendations #4 (if applicable): Encourage pt to participate in care, promote more independence - keep bedside table on R side  Individualized Interventions/Recommendations #5 (if applicable): Cluster care to promote rest  Taken 12/31/2019 1534 by Sigmund Hazel, RN  Guidelines: Inpatient Nursing Guidelines

## 2020-01-02 NOTE — Interdisciplinary (Addendum)
01/02/20 1423   Follow Up/Progress   Is the Patient Clinically Ready for Discharge * Yes   Barriers to Discharge * Clinical reason   Anticipated Discharge Dispostion/Needs Acute Rehab   Post Acute Services Referred To Acute Rehab   Patient/Family/Legal/Surrogate Decision Maker Has Been Given a List Options And Choice In The Selection of Post-Acute Care Providers * Yes   Family/Caregiver's Assessed for * Not Applicable   Respite Care * Not Applicable   Patient/Family/Other Are In Agreement With Discharge Plan * Yes   Public Health Clearance Needed * Not Applicable       Medical Intervention(s) requiring continued Hospital Stay:  Medically cleared for transfer to rehab unit     Anticipated dispo plan (SNF, Home, ILF, etc.) and anticipated DC needs (None, HHPT, DME, etc.):  Informed [facility] CM Angelita Ingles [phone], pt will need rehab unit for DC. Pt will need to transfer to [facility] prior to discharging to rehab unit.    Spoke to [place] at [facility] ph [phone], she will notify their MD for peer 2 peer.    [facility] CM is Thur[place] Hole ph# [phone]. She will reach out to [place] to determine if pt can transfer straight to rehab unit instead of to [place].       Barriers to Discharge:  None

## 2020-01-02 NOTE — Interdisciplinary (Signed)
Complete skin care.  Head to toes.    Left shoulder/scapula road rashes, area is clean w/ Xeroform over.  Patient and mom do not want this changed. Only changed the abdominal pads.    Left lateral thigh with staples, primary dressing left intact.  Only removed the mepilex.  All sutures are intact, incisions are clean and approxmated.    Anterior knee scabs, left open to air.    Left medial ankle wound. Cleaned w/ wound cleanser and applied Xeroform and Mepilex over.  Kept the Xeroform inside the wound, not over the healthy skin.    Right shin incision is sutured. No drainage and well approximated.  Left open to air.    Mid back dressing was changed this morning. Left area as is.     All these were done in the presence of patient's mother and sister.    Patient tolerated it well.

## 2020-01-02 NOTE — Interdisciplinary (Signed)
Occupational Therapy Daily Treatment Note    Admitting Physician:  Forestine Chute, MD  Admission Date 12/28/2019    Inpatient Diagnosis:   Problem List       Codes    Impaired mobility and ADLs     ICD-10-CM: Z74.09, Z78.9  ICD-9-CM: V49.89    Impaired gait and mobility     ICD-10-CM: R26.89  ICD-9-CM: 781.2          IP Start of Service  Start of Care: 12/30/19  Reason for referral: Activity tolerance limitation;Decline in functional ability/mobility;Decline in performance of activities of daily living (ADL);Range of motion/strength limitations    Preferred Language:English         History reviewed. No pertinent past medical history.   No past surgical history on file.    OT Acute     Row Name 01/02/20 1100          Type of Visit    Type of Occupational Therapy note  Occupational Therapy Daily Treatment Note     Row Name 01/02/20 1100          Treatment Time    Treatment Start Time  0845     Total TIMED Treatment (min)  75     Total Treatment Time (min)  75     Row Name 01/02/20 1100          Treatment Precautions/Restrictions    Precautions/Restrictions  Fall;Multiple lines;Weight bearing restrictions;Spine     Left Upper Extremity  Non-weight bearing     Left Lower Extremity  Weight bearing as tolerated     Other Precautions/Restrictions Information  LUE sling, L LE ROM brace locked in extension for standing, TLSO for comfort, c-collar all times     Row Name 01/02/20 1100          Medical History    Fall history  No falls reported in the last 6 months     Dominant Side  Right     Row Name 01/02/20 1100          Functional History    Prior Level of Function  No deficits     General ADL/Self-Care Assistance Needs  None- Independent with ADLs and self care     Equipment required for mobility in the home  None     Other Functional History Information  IND PLOF, works for [employer] Name 01/02/20 1100          Subjective    Patient status  Patient agreeable to treatment;Nursing in agreement for  treatment;Patient pain control adequate to participate in therapy;Nursing notified of increased pain after treatment     Row Name 01/02/20 1100          Pain Assessment    Pain Asssessment Tool  Numeric Pain Rating Scale     Other Pain Assessment Information  Reports global pain, localized to scapular pain in sitting. Does not quantify, received premedication for therapy     Row Name 01/02/20 1100          [place] AM-PAC: Daily Activity    Assistance Needed to Put on and Take off Regular Lower Body Clothing  2     Assistance Needed to Bathe, Including Washing, Rinsing, and Drying  2     Assistance Needed to Toilet [employer], Bedpan, or Urinal)  2     Assistance Needed to Put on and Take off Regular Upper Body Clothing  2     Assistance  Needed to Take Care of Personal Grooming Such as Brushing Teeth  2     Assistance Needed to Eat Meals  2     AM-PAC Daily Activity Total Score  12     AMP-PAC Daily Activity Impairment rating  Score 9-13 - 60-79% impaired     Row Name 01/02/20 1100          Objective    Overall Cognitive Status  Impaired     Other  Cognitive Status Information  Impaired safety awareness     Communication  No communication limitations or impairments noted. Current status of hearing, speech and vision allow functional communication.     Coordination/Motor control  Other (comment)     Coordination/Motor Control  Information  LUE numbness reported, non functional L grasp and +edema throughout digits     Balance  Balance limitations present     Static Sitting Balance  Fair - able to maintain balance with handhold support, may require occasional minimal assistance     Dynamic Sitting Balance  Poor - unable to accept challenge or move without loss of balance     Static Standing Balance  Poor - requires handhold support and moderate to maximal assistance to maintain position     Dynamic Standing Balance  Not tested     Functional Mobility  Functional mobility deficits present     Bed Mobility  Maximum assistance  (50-75% assistance)     Bed Mobility Comments  2 person assist     Other Objective Findings  OT arranged for premedication for therapy. Mother and sister present. Incremental HOB raising prior to pivot to seated EOB (unablet complete log roll 2/2 L LE brace and pain with rolling). Max assist x2 to EOB and pt otlerated seated for approx 2 min with VCs for pursed lipped breathing. Request to return to supine 2/2 dizziness, DEPx2 for positioning to supine for rest break. Aspen collar causing pain, OT adjusted for better fit. Pt completed bed mobility again with max assist x2. Pt completed functional transfer to standing with max assist x2, using FWW with R hand and therapist supporting LUE. Tolerated standing approx 1 min with tactile cuing for thoracic extension. Retuned to seated with mod assist x2, then supine with DEPx2. Education on role of OT for LUE NMR, and importance of AAROM digit flexion/extension and elevation for management of edema. Education to pt/family on goals of OT and progression in therapy.          OT Acute Tool Box     Row Name 01/02/20 1100          Cognition Assessment    Overall Cognitive Status  Impaired             Eval cont.     Row Name 01/02/20 1100          Patient/Family Education    Learner(s)  Patient     Learner response to rehab patient education interventions  Verbalizes understanding     Row Name 01/02/20 1100          Assessment    Assessment Pt is making excellent therapy progress. Pt is limited 2/2 impaired LUE strength and coordination (bulky jones immobilizes elbow, edematous digits with no movements), high pain, decreased activity tolerance, and multiple movement precautions. Pt will continue to benefit from IP OT to max functional indep and safety. Anticipate pt to be an excellent ARU candidate: IND and active at baseline, has supportive family at bedside engaged  in therapy, and can tolerate intensive therapy when given extra time for rest breaks.      Recommend  -Incremental HOB raising prior to seated EOB 2/2 pt report of dizziness  -[place]our[place] AAROM/PROM for L digits   -Elevation of LUE using 1-2 pillows     Rehab Potential  Good     Row Name 01/02/20 1100          Patient stated Goal    Patient stated goal  Get up and walk to the chair     Row Name 01/02/20 1100          Planned Therapy Interventions and Rationale    Patient Education  to increase independence in functional activities     Self-Care/ADL Training  to improve safety when completing daily activities and self care     Therapeutic Activities  to improve transfers between surfaces;to improve ability to perform self care and ADL's     [place] Name 01/02/20 1100          Treatment Plan Disussion    Treatment Plan Discussion and Agreement  Patient/family/caregiver stated understanding and agreement with the therapy plan     [place] Name 01/02/20 1100          Treatment Plan    Continue therapy to address  Activity tolerance limitation;Decline in functional ability/mobility;Decline in performance of activities of daily living (ADL);Range of Motion/Strength limitations;Safety/judgement impairment     Frequency of treatment  5 times per week     Duration of treatment (number of visits)  Treatment will continue while in hospital and in need of skilled therapy services     Status of treatment  Patient evaluated and will benefit from ongoing skilled therapy     Interdisciplinary Recommendations  Physical Therapy consult;Speech Therapy consult;Rehab/PM&R consult     Row Name 01/02/20 1100          Patient Safety Considerations    Patient safety considerations  Patient returned to bed at end of treatment;Patient may be at risk for falls;Nursing notified of safety considerations at end of treatment     Patient assistive device requirements for safe ambulation  Wheelchair     Row Name 01/02/20 1100          Post Acute Discharge Recommendations    Discharge Rehabilitation Reccomendations ([place])  If medically  appropriate and available, patient demonstrates tolerance to participate in skilled therapy at the following anticipated level     Therapy level  Acute Rehabilitation Unit     Equipment recommendations  To be determined as patient progresses in therapy     [place] Name 01/02/20 1100          Therapy Plan Communication    Therapy Plan Communication  Discussed therapy plan with Nursing and/or Physician     [place] Name 01/02/20 1100          Occupational Therapy Patient Discharge Instructions    Your Occupational Therapist suggests the following  Continue to follow your prescribed mobility precautions when transferring to the chair and toilet as instructed     [place] Name 01/02/20 1100          Therapeutic Procedures    Neuromuscular Re-education [id number])  Anticipatory postural adjustment training;Coordination activities;Dynamic balance training during ADLs;Patient education;Trunk alignment/stability activities;Facilitation of protective responses and balance reactions         Total TIMED Treatment (min)  60     Therapeutic Exercise ([id number])  Home Exercise Program (HEP) demonstration and performance  Total TIMED Treatment (min)  15           The occupational therapist of record is endorsed by evaluating occupational therapist.

## 2020-01-02 NOTE — Interdisciplinary (Signed)
01/02/20 1501   Discharge Plan   Patient Has Decision Making Capacity * Yes   Verified phone number for DC location * Yes   Verified address for DC location * Yes   Patient/Family/Legal/Surrogate Decision Maker Has Been Given a List Options And Choice In The Selection of Post-Acute Care Providers * Not Applicable   Public Health Clearance Needed * Not Applicable   Name and Address of Facility Transferring To * [facility]  ([address] ; RN to call report to [phone])   Receiving Physician/Contact Name * Dr. Cassandria Santee   Discharge Transportation   Transportation *  Ambulance   Transportation Arrangement Details * [facility]  [phone]  (BLS transport set up for 1800 pick up time)   Final Discharge Destination/Services   Final Discharge Destination/Services * Acute Hospital

## 2020-01-02 NOTE — Interdisciplinary (Signed)
Went over the discharge instructions with patient, mother and sister.  Plastic attending came to see patient prior to transfer.    Report given to ambulance.  Patient will be transferred to [facility].    Personal belongings and valuables are all with pt.  Given extra liners for the [employer].      Questions/concerns addressed.
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
## 2020-01-03 ENCOUNTER — Encounter (HOSPITAL_BASED_OUTPATIENT_CLINIC_OR_DEPARTMENT_OTHER): Payer: Self-pay | Admitting: Plastic Surgery

## 2020-01-03 DIAGNOSIS — G540 Brachial plexus disorders: Secondary | ICD-10-CM

## 2020-01-03 DIAGNOSIS — T798XXS Other early complications of trauma, sequela: Secondary | ICD-10-CM

## 2020-01-06 ENCOUNTER — Encounter (HOSPITAL_COMMUNITY): Payer: Self-pay | Admitting: Orthopaedic Surgery
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
## 2020-01-13 ENCOUNTER — Encounter (INDEPENDENT_AMBULATORY_CARE_PROVIDER_SITE_OTHER): Payer: TRICARE Prime—HMO | Admitting: Orthopaedic Surgery

## 2020-01-14 ENCOUNTER — Telehealth (HOSPITAL_BASED_OUTPATIENT_CLINIC_OR_DEPARTMENT_OTHER): Payer: Self-pay | Admitting: Plastic Surgery

## 2020-01-14 NOTE — Telephone Encounter (Addendum)
I spoke to the patients insurance and all his studies MRI and EMG along with the visit have been denied by the insurance, per Era Bumpers at [employer] patient has to contact his PCP to be referred to another in network provider , PCP can also submit to [facility] but it will be later determined if it's medically necessary to be done at [facility]. I spoke to the patient's mother and she will have him give me a call .      Eric Fry

## 2020-01-16 ENCOUNTER — Encounter (HOSPITAL_COMMUNITY): Payer: Self-pay | Admitting: Orthopaedic Surgery

## 2021-07-16 ENCOUNTER — Other Ambulatory Visit: Payer: Self-pay

## 2021-07-16 ENCOUNTER — Emergency Department
Admission: EM | Admit: 2021-07-16 | Discharge: 2021-07-16 | Disposition: A | Discharge status: Home / Self Care | Payer: Self-pay | Attending: Emergency Medicine | Admitting: Emergency Medicine

## 2021-07-16 ENCOUNTER — Emergency Department: Payer: Self-pay

## 2021-07-16 ENCOUNTER — Encounter: Payer: Self-pay | Admitting: Emergency Medicine

## 2021-07-16 DIAGNOSIS — M25561 Pain in right knee: Secondary | ICD-10-CM

## 2021-07-16 DIAGNOSIS — M25569 Pain in unspecified knee: Secondary | ICD-10-CM

## 2021-07-16 DIAGNOSIS — M25461 Effusion, right knee: Secondary | ICD-10-CM | POA: Insufficient documentation

## 2021-07-16 MED ORDER — HYDROCODONE-ACETAMINOPHEN 5-325 MG PO TABS: 1.0000 | ORAL_TABLET | Freq: Four times a day (QID) | ORAL | 0 refills | Status: AC | PRN

## 2021-07-16 MED ORDER — MELOXICAM 15 MG PO TABS: 15.0000 mg | ORAL_TABLET | Freq: Every day | ORAL | 0 refills | Status: AC

## 2021-07-16 NOTE — ED Provider Notes (Signed)
[facility] Emergency Department Provider Note  ____________________________________________   Event Date/Time   First MD Initiated Contact with Patient 07/16/21 1124     (approximate)  I have reviewed the triage vital signs and the nursing notes.   HISTORY  Chief Complaint Knee Pain    HPI Terry Bass is a 30 y.o. male presents to the ED with complaint of right knee swelling and pain since last night.  Patient denies any recent injury to his knee.  He states he is aware that he has a cyst on the back of his knee which to his knowledge has not changed in size.  He has had problems with his left knee in the past but no problems with his right knee.  He rates his pain as an 8 out of 10.       History reviewed. No pertinent past medical history.  There are no problems to display for this patient.   History reviewed. No pertinent surgical history.  Prior to Admission medications   Medication Sig Start Date End Date Taking? Authorizing Provider  HYDROcodone-acetaminophen (NORCO/VICODIN) 5-325 MG tablet Take 1 tablet by mouth every 6 (six) hours as needed for moderate pain. 07/16/21 07/16/22 Yes Tommi Rumps, PA-C  meloxicam (MOBIC) 15 MG tablet Take 1 tablet (15 mg total) by mouth daily. 07/16/21 07/16/22 Yes Tommi Rumps, PA-C    Allergies Patient has no known allergies.  History reviewed. No pertinent family history.  Social History    Review of Systems Constitutional: No fever/chills Eyes: No visual changes. Cardiovascular: Denies chest pain. Respiratory: Denies shortness of breath. Musculoskeletal: Positive right knee pain. Skin: Negative for rash. Neurological: Negative for headaches, focal weakness or numbness. ____________________________________________   PHYSICAL EXAM:  VITAL SIGNS: ED Triage Vitals  Enc Vitals Group     BP 07/16/21 1056 (!) 168/99     Pulse Rate 07/16/21 1056 79     Resp 07/16/21 1056  20     Temp 07/16/21 1056 98.3 F (36.8 C)     Temp Source 07/16/21 1056 Oral     SpO2 07/16/21 1056 98 %     Weight 07/16/21 1058 200 lb (90.7 kg)     Height 07/16/21 1058 6\' 1"  (1.854 m)     Head Circumference --      Peak Flow --      Pain Score 07/16/21 1057 8     Pain Loc --      Pain Edu? --      Excl. in GC? --     Constitutional: Alert and oriented. Well appearing and in no acute distress. Eyes: Conjunctivae are normal.  Head: Atraumatic. Neck: No stridor.   Cardiovascular: Normal rate, regular rhythm. Grossly normal heart sounds.  Good peripheral circulation. Respiratory: Normal respiratory effort.  No retractions. Lungs CTAB. Musculoskeletal: On examination of the right knee there is no gross deformity however there is a large Baker's cyst posteriorly that is extremely tender to light palpation.  Range of motion is slow and guarded.  Soft tissue edema is noted anteriorly around the patella area.  No erythema or warmth is appreciated.  Skin is intact. Neurologic:  Normal speech and language. No gross focal neurologic deficits are appreciated.  Skin:  Skin is warm, dry and intact. No rash noted. Psychiatric: Mood and affect are normal. Speech and behavior are normal.  ____________________________________________   LABS (all labs ordered are listed, but only abnormal results are displayed)  Labs Reviewed -  No data to display ____________________________________________  RADIOLOGY Beaulah Corin, personally viewed and evaluated these images (plain radiographs) as part of my medical decision making, as well as reviewing the written report by the radiologist.   Official radiology report(s): DG Knee Complete 4 Views Right  Result Date: 07/16/2021 CLINICAL DATA:  Right knee pain and swelling beginning yesterday. No known injury. EXAM: RIGHT KNEE - COMPLETE 4+ VIEW COMPARISON:  None. FINDINGS: No evidence of fracture or dislocation. A large knee joint effusion is seen.  No evidence of arthropathy or other focal bone abnormality. A surgical clip is seen in the soft tissues along the posteromedial aspect of the proximal tibial metaphysis. Soft tissues are otherwise unremarkable. IMPRESSION: Large knee joint effusion. No osseous abnormality identified. Electronically Signed   By: Danae Orleans M.D.   On: 07/16/2021 11:37    ____________________________________________   PROCEDURES  Procedure(s) performed (including Critical Care):  Procedures   ____________________________________________   INITIAL IMPRESSION / ASSESSMENT AND PLAN / ED COURSE  As part of my medical decision making, I reviewed the following data within the electronic MEDICAL RECORD NUMBER Notes from prior ED visits and [place] Controlled Substance Database  30 year old male presents to the ED with complaint of right knee pain that began last evening.  Patient denies any recent injury to his knee.  He is aware that he has a Baker's cyst and on palpation this is a very large Baker's cyst which is tender to palpation.  X-rays were reviewed with the patient.  A knee immobilizer was applied to the right knee.  He is strongly encouraged to follow-up with the orthopedist listed on his discharge papers for further evaluation.  Meloxicam 15 mg 1 daily with food and hydrocodone as needed for moderate to severe pain.  ____________________________________________   FINAL CLINICAL IMPRESSION(S) / ED DIAGNOSES  Final diagnoses:  Effusion of right knee  Acute pain of right knee     ED Discharge Orders          Ordered    meloxicam (MOBIC) 15 MG tablet  Daily        07/16/21 1205    HYDROcodone-acetaminophen (NORCO/VICODIN) 5-325 MG tablet  Every 6 hours PRN        07/16/21 1205             Note:  This document was prepared using Dragon voice recognition software and may include unintentional dictation errors.    Tommi Rumps, PA-C 07/16/21 1213    Shaune Pollack, MD 07/18/21 [phone]

## 2021-07-16 NOTE — Discharge Instructions (Addendum)
Call make an appointment with Dr. Franco Collet who is the orthopedist on-call today.  A follow-up appointment will be needed to further evaluate any continued knee problem.  Wear knee immobilizer when you are up walking.  You do not have to wear this to sleep.  2 prescriptions were sent to the pharmacy.  1 is for inflammation to be taken with food once a day.  The other is for pain and should not be taken if you are not driving or operating any machinery.  This medication is a narcotic and could cause drowsiness.

## 2021-07-16 NOTE — ED Triage Notes (Signed)
Pt via POV from home. Pt c/o R knee swelling and pain since last night. Pt has a cyst on the back of the leg but pt states no changes in the cyst since the pain started. Pt is A&Ox4 and NAD. Ambulatory to triage.

## 2021-09-15 ENCOUNTER — Other Ambulatory Visit: Payer: Self-pay | Admitting: Orthopedic Surgery

## 2021-09-15 DIAGNOSIS — M25512 Pain in left shoulder: Secondary | ICD-10-CM

## 2021-10-01 ENCOUNTER — Other Ambulatory Visit: Payer: Self-pay

## 2021-10-01 ENCOUNTER — Ambulatory Visit
Admission: RE | Admit: 2021-10-01 | Discharge: 2021-10-01 | Disposition: A | Discharge status: Home / Self Care | Source: Ambulatory Visit | Attending: Orthopedic Surgery | Admitting: Orthopedic Surgery

## 2021-10-01 DIAGNOSIS — M25512 Pain in left shoulder: Secondary | ICD-10-CM

## 2022-01-21 ENCOUNTER — Other Ambulatory Visit: Payer: Self-pay | Admitting: Neurology

## 2022-01-30 ENCOUNTER — Ambulatory Visit
Admission: RE | Admit: 2022-01-30 | Discharge: 2022-01-30 | Disposition: A | Discharge status: Home / Self Care | Payer: No Typology Code available for payment source | Source: Ambulatory Visit | Attending: Neurology | Admitting: Neurology

## 2022-01-30 DIAGNOSIS — M546 Pain in thoracic spine: Secondary | ICD-10-CM | POA: Diagnosis present

## 2022-01-30 DIAGNOSIS — M62512 Muscle wasting and atrophy, not elsewhere classified, left shoulder: Secondary | ICD-10-CM | POA: Diagnosis not present

## 2022-01-30 DIAGNOSIS — M25512 Pain in left shoulder: Secondary | ICD-10-CM | POA: Diagnosis not present

## 2022-01-30 DIAGNOSIS — Z981 Arthrodesis status: Secondary | ICD-10-CM | POA: Insufficient documentation

## 2022-01-30 DIAGNOSIS — G96191 Perineural cyst: Secondary | ICD-10-CM | POA: Diagnosis not present

## 2022-01-30 DIAGNOSIS — R413 Other amnesia: Secondary | ICD-10-CM | POA: Insufficient documentation

## 2022-01-30 DIAGNOSIS — M50223 Other cervical disc displacement at C6-C7 level: Secondary | ICD-10-CM | POA: Insufficient documentation

## 2022-01-30 DIAGNOSIS — M4802 Spinal stenosis, cervical region: Secondary | ICD-10-CM | POA: Insufficient documentation

## 2022-01-30 DIAGNOSIS — M25511 Pain in right shoulder: Secondary | ICD-10-CM | POA: Insufficient documentation

## 2023-10-27 IMAGING — CT CT SHOULDER*L* W/O CM
2 of 3 series · 8 of 14 positions shown, 9 images · non-contrast
Comparison: Left shoulder x-rays dated July 07, 2021.

CLINICAL DATA: Assess glenohumeral and acromiohumeral fusion.

EXAM:
CT OF THE UPPER LEFT EXTREMITY WITHOUT CONTRAST
TECHNIQUE: Multidetector CT imaging of the upper left extremity was performed
according to the standard protocol.
RADIATION DOSE REDUCTION: This exam was performed according to the
departmental dose-optimization program which includes automated
exposure control, adjustment of the mA and/or kV according to
patient size and/or use of iterative reconstruction technique.

[Series 6: thin st · axial · 0.52mm/px · z∈[-166,-39]mm · 4 of 213 slices shown, 5 images]
[im 43/213  soft-tissue]
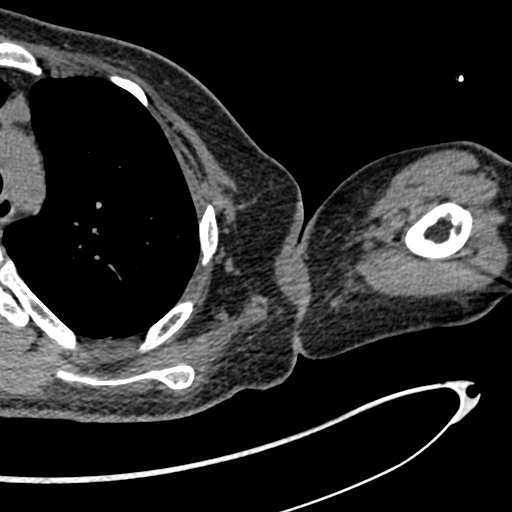
[im 43/213  bone]
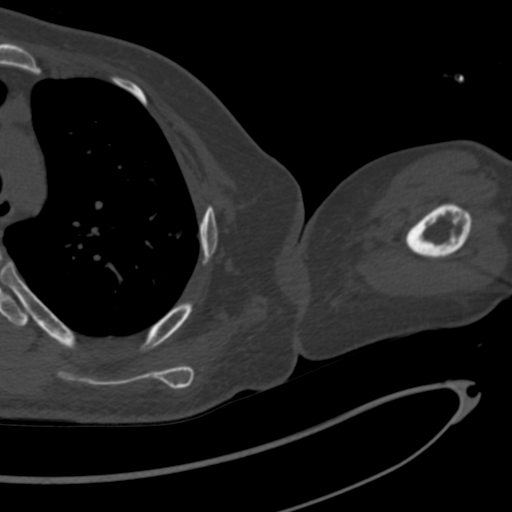
[im 85/213  bone]
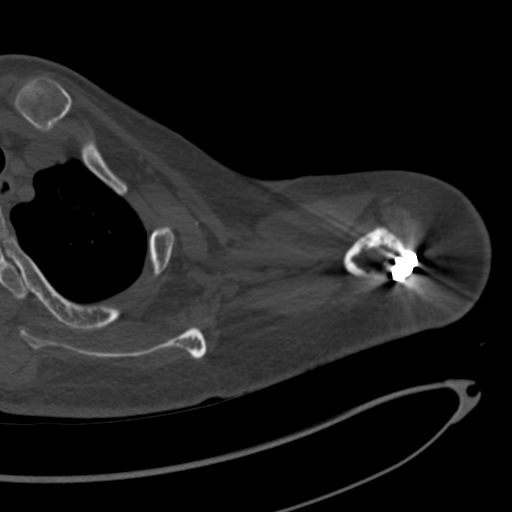
[im 128/213  bone]
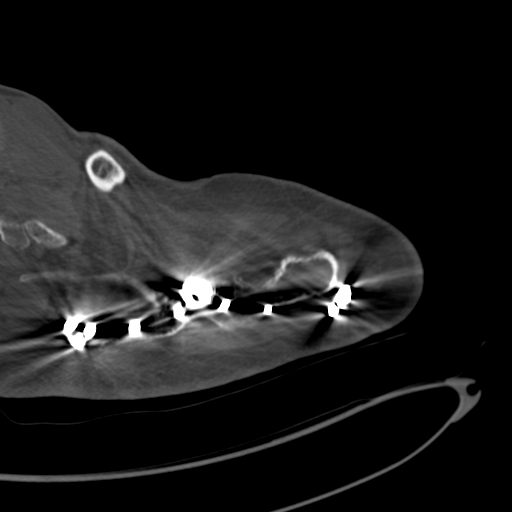
[im 170/213  bone]
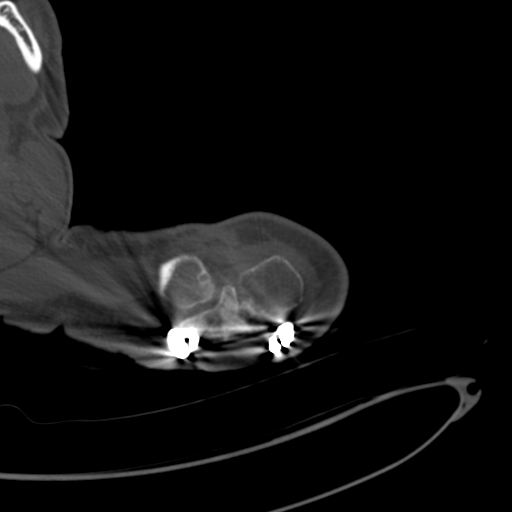

[Series 9: thin (person_name)/(person_name) · axial · 0.52mm/px · z∈[-166,-39]mm · 4 of 213 slices shown]
[im 43/213  bone]
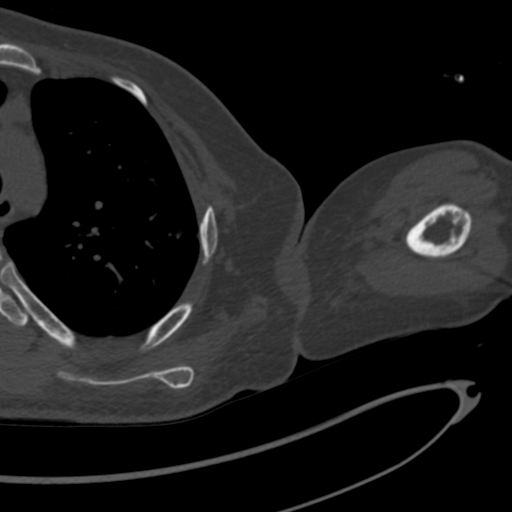
[im 85/213  bone]
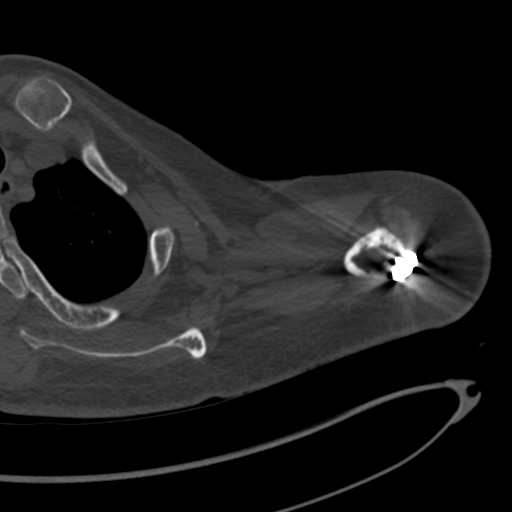
[im 128/213  bone]
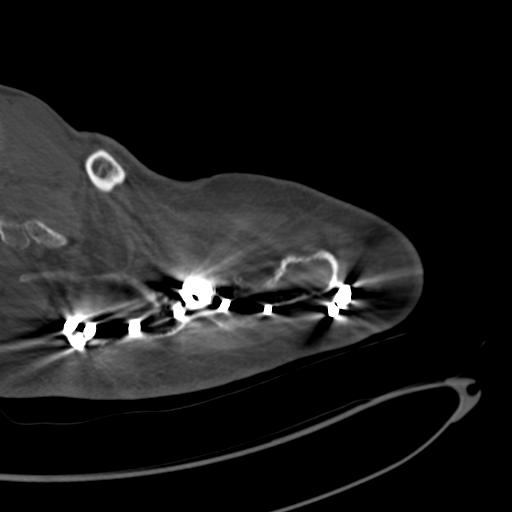
[im 170/213  bone]
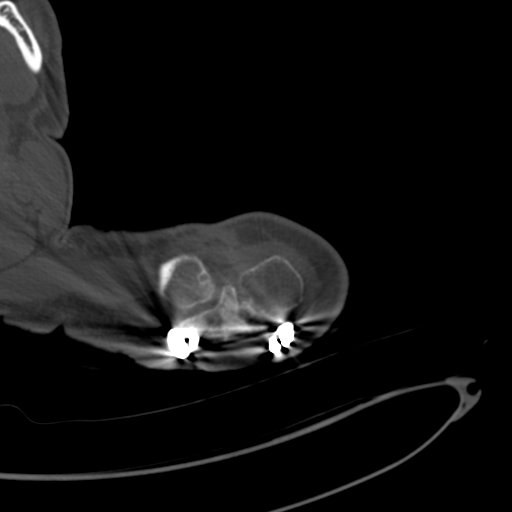

[8 of 14 positions shown; findings below may reference images not displayed]

FINDINGS: Bones/Joint/Cartilage

Status post glenohumeral and acromiohumeral fusion with solid
arthrodesis. No evidence of hardware failure or loosening. No
fracture or dislocation. Osteopenia. No joint effusion.

Ligaments

Ligaments are suboptimally evaluated by CT.

Muscles and Tendons
Grossly intact.

Soft tissue
No fluid collection or hematoma.  No soft tissue mass.

Small calcified granuloma in the left upper lobe. Multiple calcified
left hilar lymph nodes.
IMPRESSION: 1. Solid glenohumeral and acromiohumeral osseous fusion. No hardware
complication.
